# Patient Record
Sex: Male | Born: 1970 | Race: White | Hispanic: No | Marital: Married | Smoking: Never smoker
Health system: Southern US, Community
[De-identification: ages and names within clinical notes are randomized; demographics above are authoritative.]

## PROBLEM LIST (undated history)

## (undated) DIAGNOSIS — R112 Nausea with vomiting, unspecified: Secondary | ICD-10-CM

## (undated) DIAGNOSIS — G43909 Migraine, unspecified, not intractable, without status migrainosus: Secondary | ICD-10-CM

## (undated) DIAGNOSIS — C801 Malignant (primary) neoplasm, unspecified: Secondary | ICD-10-CM

## (undated) DIAGNOSIS — Z87442 Personal history of urinary calculi: Secondary | ICD-10-CM

## (undated) DIAGNOSIS — F419 Anxiety disorder, unspecified: Secondary | ICD-10-CM

## (undated) DIAGNOSIS — Z9889 Other specified postprocedural states: Secondary | ICD-10-CM

## (undated) DIAGNOSIS — I10 Essential (primary) hypertension: Secondary | ICD-10-CM

## (undated) HISTORY — PX: SHOULDER ARTHROSCOPY WITH BICEPS TENDON REPAIR: SHX5674

## (undated) HISTORY — PX: BACK SURGERY: SHX140

## (undated) HISTORY — PX: KNEE SURGERY: SHX244

## (undated) HISTORY — PX: CERVICAL SPINE SURGERY: SHX589

## (undated) HISTORY — DX: Malignant (primary) neoplasm, unspecified: C80.1

## (undated) HISTORY — DX: Anxiety disorder, unspecified: F41.9

## (undated) HISTORY — PX: MELANOMA EXCISION: SHX5266

---

## 2000-01-22 ENCOUNTER — Encounter: Payer: Self-pay | Admitting: Orthopedic Surgery

## 2000-01-22 ENCOUNTER — Encounter: Admission: RE | Admit: 2000-01-22 | Discharge: 2000-01-22 | Payer: Self-pay | Admitting: Orthopedic Surgery

## 2001-05-20 ENCOUNTER — Encounter
Admission: RE | Admit: 2001-05-20 | Discharge: 2001-05-20 | Payer: Self-pay | Admitting: Physical Medicine and Rehabilitation

## 2001-05-20 ENCOUNTER — Encounter: Payer: Self-pay | Admitting: Physical Medicine and Rehabilitation

## 2007-08-20 ENCOUNTER — Emergency Department (HOSPITAL_COMMUNITY): Admission: EM | Admit: 2007-08-20 | Discharge: 2007-08-20 | Payer: Self-pay | Admitting: Emergency Medicine

## 2010-07-02 ENCOUNTER — Ambulatory Visit (HOSPITAL_BASED_OUTPATIENT_CLINIC_OR_DEPARTMENT_OTHER): Admission: RE | Admit: 2010-07-02 | Discharge: 2010-07-02 | Payer: Self-pay | Admitting: Orthopedic Surgery

## 2010-12-06 LAB — POCT HEMOGLOBIN-HEMACUE: Hemoglobin: 14.9 g/dL (ref 13.0–17.0)

## 2011-01-16 ENCOUNTER — Ambulatory Visit (HOSPITAL_BASED_OUTPATIENT_CLINIC_OR_DEPARTMENT_OTHER)
Admission: RE | Admit: 2011-01-16 | Discharge: 2011-01-16 | Disposition: A | Discharge status: Home / Self Care | Payer: Federal, State, Local not specified - PPO | Source: Ambulatory Visit | Attending: Orthopedic Surgery | Admitting: Orthopedic Surgery

## 2011-01-16 DIAGNOSIS — M19079 Primary osteoarthritis, unspecified ankle and foot: Secondary | ICD-10-CM | POA: Insufficient documentation

## 2011-01-16 DIAGNOSIS — Z01812 Encounter for preprocedural laboratory examination: Secondary | ICD-10-CM | POA: Insufficient documentation

## 2011-01-16 DIAGNOSIS — D2120 Benign neoplasm of connective and other soft tissue of unspecified lower limb, including hip: Secondary | ICD-10-CM | POA: Insufficient documentation

## 2011-01-16 DIAGNOSIS — M959 Acquired deformity of musculoskeletal system, unspecified: Secondary | ICD-10-CM | POA: Insufficient documentation

## 2011-01-16 LAB — POCT HEMOGLOBIN-HEMACUE
Hemoglobin: 15.8 g/dL (ref 13.0–17.0)
Hemoglobin: 16.2 g/dL (ref 13.0–17.0)

## 2011-01-27 NOTE — Op Note (Signed)
NAMEJAYMIR, Chad Cole NO.:  [id number]  MEDICAL RECORD NO.:  [id number]           PATIENT TYPE:  LOCATION:                                 FACILITY:  PHYSICIAN:  Leonides Grills, M.D.          DATE OF BIRTH:  DATE OF PROCEDURE:  01/16/2011 DATE OF DISCHARGE:                              OPERATIVE REPORT   PREOPERATIVE DIAGNOSES: 1. Left deep peroneal nerve neuroma. 2. Left great toe interphalangeal joint arthritis.  POSTOPERATIVE DIAGNOSES: 1. Left deep peroneal nerve neuroma. 2. Left great toe interphalangeal joint arthritis. 3. Osteochondral lesion, left great toe, proximal phalanx head.  PROCEDURES: 1. Left excision deep peroneal nerve neuroma. 2. Left great toe IP joint cheilectomy. 3. Debridement drilling osteochondral lesion.  ANESTHESIA:  General.  SURGEON:  Leonides Grills, MD  ASSISTANT:  Richardean Canal, PA  ESTIMATED BLOOD LOSS:  Minimal.  TOURNIQUET TIME:  Approximately 20 minutes.  COMPLICATIONS:  None.  DISPOSITION:  Stable to PR.  INDICATIONS:  This is a 40 year old male who underwent an excision of a spur on the dorsal aspect of the second TMT joint.  Postoperatively, this is complicated by a neuroma in this area.  Preoperatively, he did not have a dorsalis pedis pulse in this area as well.  If there was any pulse, it was thready compared to the contralateral side.  He was consented for the above procedure.  All risks of infection, vessel injury, possibility we would not find the neuroma, possibility that the neuroma may recur, persistent pain, worsening pain, prolonged recovery, stiffness, arthritis, wound healing problems, DVT, PE were all explained.  Questions were encouraged and answered.  DESCRIPTION OF PROCEDURE:  The patient was brought to the operating room, placed in supine position.  After adequate general endotracheal anesthesia was administered with popliteal block as well as Ancef 1 g IV piggyback, left lower extremity  was then prepped and draped in sterile manner over proximally thigh tourniquet.  Limb was gravity exsanguinated and the tourniquet was elevated to 290 mmHg.  A longitudinal incision over previous incision was then made, it was extended proximally and distally.  Dissection was carried down through the scar, this was a very slow and tenuous dissection.  We found that there was a superficial peroneal nerve ball within the scar tissue.  This was such a small branch, this was carefully dissected out and cut back.  We then found that there was a large ball of scar over the second TMT joint.  We released the tourniquet at this point and palpated the area.  There was no true distinct pulse.  There was a thread of possible pulse under this area, but there was no visual pulses well.  We were almost directly on the joint area.  There was a ball of scar in this area and we traced proximal to this and again we found no distinct structure into this area.  We then excised the scar in this area and found that there was no other evidence of neuroma or anything else in this area but was palpated underneath the skin was the  scar that was resected.  Once this was done, we copiously irrigated the area with normal saline.  There was no pulsatile bleeding and again we did not palpate a true dorsalis pedis pulse.  We then made a longitudinal incision on dorsal aspect of the left great toe IP joint.  Dissection was carried down through skin. Hemostasis was obtained.  An EHL tendon was carefully elevated and a capsulotomy of the IP joint was then made.  There was a spur on the top but it was not too large.  This was then removed with the rongeur.  We then entered the IP joint and then laterally, we found that there was a large osteochondral lesion at the third and lateral surface of the proximal phalanx head.  This was then debrided with a House curette and drill holes were placed.  We then removed the dorsal  lateral edge of the proximal phalanx head with a rongeur.  We then ranged the joint and there was no impingement or crepitus in this area.  The dorsal spur off the proximal phalanx was also removed as well.  We do not want to be too aggressive with this due to the fact we did not want to violate the extensor halluces longus to extend insertion.  The area was copiously irrigated with normal saline and this was done under no tourniquet.  The area was copiously irrigated with normal saline.  Subcu was closed with 3-0 Vicryl over all wounds.  Skin was closed with 4-0 nylon.  Sterile dressing was applied.  The patient was stable to the PR.     Leonides Grills, M.D.     PB/MEDQ  D:  01/16/2011  T:  01/17/2011  Job:  [id number]  Electronically Signed by Leonides Grills M.D. on 01/27/2011 09:23:10 AM

## 2012-09-23 HISTORY — PX: GANGLION CYST EXCISION: SHX1691

## 2012-10-24 DIAGNOSIS — C801 Malignant (primary) neoplasm, unspecified: Secondary | ICD-10-CM

## 2012-10-24 HISTORY — DX: Malignant (primary) neoplasm, unspecified: C80.1

## 2013-07-13 ENCOUNTER — Ambulatory Visit (INDEPENDENT_AMBULATORY_CARE_PROVIDER_SITE_OTHER): Payer: Federal, State, Local not specified - PPO | Admitting: Cardiovascular Disease

## 2013-07-13 ENCOUNTER — Encounter (HOSPITAL_COMMUNITY): Payer: Self-pay | Admitting: *Deleted

## 2013-07-13 ENCOUNTER — Encounter: Payer: Self-pay | Admitting: Cardiovascular Disease

## 2013-07-13 VITALS — BP 138/100 | HR 62 | Ht 67.5 in | Wt 184.0 lb

## 2013-07-13 DIAGNOSIS — R609 Edema, unspecified: Secondary | ICD-10-CM

## 2013-07-13 DIAGNOSIS — C4491 Basal cell carcinoma of skin, unspecified: Secondary | ICD-10-CM

## 2013-07-13 DIAGNOSIS — Z79899 Other long term (current) drug therapy: Secondary | ICD-10-CM

## 2013-07-13 DIAGNOSIS — Z8669 Personal history of other diseases of the nervous system and sense organs: Secondary | ICD-10-CM

## 2013-07-13 DIAGNOSIS — R600 Localized edema: Secondary | ICD-10-CM

## 2013-07-13 DIAGNOSIS — Z8249 Family history of ischemic heart disease and other diseases of the circulatory system: Secondary | ICD-10-CM

## 2013-07-13 DIAGNOSIS — M7989 Other specified soft tissue disorders: Secondary | ICD-10-CM

## 2013-07-13 DIAGNOSIS — R5381 Other malaise: Secondary | ICD-10-CM

## 2013-07-13 NOTE — Patient Instructions (Signed)
Your physician recommends that you return for lab work fasting.  Your physician has requested that you have a lower extremity arterial exercise duplex. During this test, exercise and ultrasound are used to evaluate arterial blood flow in the legs. Allow one hour for this exam. There are no restrictions or special instructions.  Your physician has requested that you have an exercise tolerance test. For further information please visit [email or web address] Please also follow instruction sheet, as given.   Your physician recommends that you schedule a follow-up appointment in: 6 WEEKS.

## 2013-07-14 ENCOUNTER — Encounter (INDEPENDENT_AMBULATORY_CARE_PROVIDER_SITE_OTHER): Payer: Self-pay | Admitting: Surgery

## 2013-07-21 ENCOUNTER — Ambulatory Visit (HOSPITAL_COMMUNITY)
Admission: RE | Admit: 2013-07-21 | Discharge: 2013-07-21 | Disposition: A | Discharge status: Home / Self Care | Payer: Federal, State, Local not specified - PPO | Source: Ambulatory Visit | Attending: Cardiovascular Disease | Admitting: Cardiovascular Disease

## 2013-07-21 DIAGNOSIS — R5381 Other malaise: Secondary | ICD-10-CM

## 2013-07-21 DIAGNOSIS — Z8249 Family history of ischemic heart disease and other diseases of the circulatory system: Secondary | ICD-10-CM

## 2013-07-21 DIAGNOSIS — M7989 Other specified soft tissue disorders: Secondary | ICD-10-CM

## 2013-07-21 LAB — COMPREHENSIVE METABOLIC PANEL
ALT: 15 U/L (ref 0–53)
AST: 17 U/L (ref 0–37)
Albumin: 4.6 g/dL (ref 3.5–5.2)
Alkaline Phosphatase: 89 U/L (ref 39–117)
BUN: 14 mg/dL (ref 6–23)
CO2: 25 mEq/L (ref 19–32)
Calcium: 9.1 mg/dL (ref 8.4–10.5)
Chloride: 106 mEq/L (ref 96–112)
Creat: 1.05 mg/dL (ref 0.50–1.35)
Glucose, Bld: 97 mg/dL (ref 70–99)
Potassium: 4.1 mEq/L (ref 3.5–5.3)
Sodium: 139 mEq/L (ref 135–145)
Total Bilirubin: 0.4 mg/dL (ref 0.3–1.2)
Total Protein: 7.1 g/dL (ref 6.0–8.3)

## 2013-07-21 LAB — LIPID PANEL
Cholesterol: 145 mg/dL (ref 0–200)
HDL: 47 mg/dL (ref >39)
LDL Cholesterol: 82 mg/dL (ref 0–99)
Total CHOL/HDL Ratio: 3.1 Ratio
Triglycerides: 81 mg/dL (ref <150)
VLDL: 16 mg/dL (ref 0–40)

## 2013-07-21 LAB — CBC
HCT: 42.5 % (ref 39.0–52.0)
Hemoglobin: 14.8 g/dL (ref 13.0–17.0)
MCH: 29.1 pg (ref 26.0–34.0)
MCHC: 34.8 g/dL (ref 30.0–36.0)
MCV: 83.7 fL (ref 78.0–100.0)
Platelets: 342 10*3/uL (ref 150–400)
RBC: 5.08 MIL/uL (ref 4.22–5.81)
RDW: 13.6 % (ref 11.5–15.5)
WBC: 6.6 10*3/uL (ref 4.0–10.5)

## 2013-07-21 LAB — VITAMIN D 25 HYDROXY (VIT D DEFICIENCY, FRACTURES): Vit D, 25-Hydroxy: 33 ng/mL (ref 30–89)

## 2013-07-21 LAB — TSH: TSH: 1.359 u[IU]/mL (ref 0.350–4.500)

## 2013-07-29 ENCOUNTER — Encounter (HOSPITAL_COMMUNITY): Payer: Federal, State, Local not specified - PPO

## 2013-08-03 ENCOUNTER — Other Ambulatory Visit (INDEPENDENT_AMBULATORY_CARE_PROVIDER_SITE_OTHER): Payer: Self-pay | Admitting: Surgery

## 2013-08-03 ENCOUNTER — Encounter (INDEPENDENT_AMBULATORY_CARE_PROVIDER_SITE_OTHER): Payer: Self-pay | Admitting: Surgery

## 2013-08-03 ENCOUNTER — Ambulatory Visit (INDEPENDENT_AMBULATORY_CARE_PROVIDER_SITE_OTHER): Payer: Federal, State, Local not specified - PPO | Admitting: Surgery

## 2013-08-03 VITALS — BP 138/92 | HR 76 | Temp 98.4°F | Resp 14 | Ht 67.5 in | Wt 184.0 lb

## 2013-08-03 DIAGNOSIS — K429 Umbilical hernia without obstruction or gangrene: Secondary | ICD-10-CM | POA: Insufficient documentation

## 2013-08-03 DIAGNOSIS — K469 Unspecified abdominal hernia without obstruction or gangrene: Secondary | ICD-10-CM

## 2013-08-03 DIAGNOSIS — R1031 Right lower quadrant pain: Secondary | ICD-10-CM

## 2013-08-03 NOTE — Progress Notes (Signed)
Patient ID: Chad Cole, male   DOB: 08/17/1971, 42 y.o.   MRN: [id number]  Chief Complaint  Patient presents with  . New Evaluation    eval RIH and UMB hernia    HPI Chad Cole is a 42 y.o. male.   HPI This is a very pleasant gentleman referred by Dr. Johny Blamer for evaluation of right groin pain. For the last several months he has had increasing pain daily in the right groin which he describes as a burning and ache.   The pain came to down into the thigh. He has not noticed a discrete bulge but reports that he looks fuller in the right groin compared to the left. He has had no nausea or vomiting or obstructive symptoms. He had a previous lymph node removed on the upper thigh during excision of a melanoma earlier this year. At that time he developed a seroma which resolved on its on. He has no other complaints. Past Medical History  Diagnosis Date  . Cancer  feb 2014    malignant melanoma. basal cell    Past Surgical History  Procedure Laterality Date  . Knee surgery      bilateral.  . Melanoma excision      one inside right thigh, one on top of head and left cheek    Family History  Problem Relation Age of Onset  . COPD Mother   . Heart attack Father   . Hypertension Father   . COPD Father   . Hyperlipidemia Father   . Cancer Father     cancer in his thumb really weird name pt says  . Bipolar disorder Brother   . Dementia Maternal Grandmother   . Diabetes Maternal Grandfather   . Heart disease Paternal Grandmother     bad heart valve  . Heart attack Paternal Grandfather   . Cancer - Lung Paternal Grandfather   . Cancer Paternal Grandfather     lung and leukemia  . Scoliosis Daughter     Social History History  Substance Use Topics  . Smoking status: Never Smoker   . Smokeless tobacco: Never Used  . Alcohol Use: No    No Known Allergies  Current Outpatient Prescriptions  Medication Sig Dispense Refill  . RELPAX 40 MG tablet        No current  facility-administered medications for this visit.    Review of Systems Review of Systems  Constitutional: Negative for fever, chills and unexpected weight change.  HENT: Negative for congestion, hearing loss, sore throat, trouble swallowing and voice change.   Eyes: Negative for visual disturbance.  Respiratory: Negative for cough and wheezing.   Cardiovascular: Negative for chest pain, palpitations and leg swelling.  Gastrointestinal: Positive for abdominal pain. Negative for nausea, vomiting, diarrhea, constipation, blood in stool, abdominal distention, anal bleeding and rectal pain.  Genitourinary: Negative for hematuria and difficulty urinating.  Musculoskeletal: Negative for arthralgias.  Skin: Negative for rash and wound.  Neurological: Negative for seizures, syncope, weakness and headaches.  Hematological: Negative for adenopathy. Does not bruise/bleed easily.  Psychiatric/Behavioral: Negative for confusion.    Blood pressure 138/92, pulse 76, temperature 98.4 F (36.9 C), temperature source Temporal, resp. rate 14, height 5' 7.5" (1.715 m), weight 184 lb (83.462 kg).  Physical Exam Physical Exam  Constitutional: He is oriented to person, place, and time. He appears well-developed and well-nourished. No distress.  HENT:  Head: Normocephalic and atraumatic.  Right Ear: External ear normal.  Nose: Nose normal.  Mouth/Throat: Oropharynx is clear and moist.  Eyes: Conjunctivae are normal. Pupils are equal, round, and reactive to light. Right eye exhibits no discharge. Left eye exhibits no discharge. No scleral icterus.  Neck: Normal range of motion. Neck supple. No tracheal deviation present.  Cardiovascular: Normal rate, regular rhythm, normal heart sounds and intact distal pulses.   No murmur heard. Pulmonary/Chest: Effort normal and breath sounds normal. No respiratory distress. He has no wheezes. He has no rales.  Abdominal: Soft. Bowel sounds are normal. He exhibits no  distension. There is no tenderness. There is no rebound.  There is a small, easily reducible umbilical hernia  With him both lying and standing with intense Valsalva maneuvers, I cannot demonstrate an inguinal hernia. The incision on his very proximal right thigh is well healed without evidence of recurrent seroma. I cannot feel any enlarged lymph nodes in the right groin  Musculoskeletal: Normal range of motion. He exhibits no edema and no tenderness.  Lymphadenopathy:    He has no cervical adenopathy.  Neurological: He is alert and oriented to person, place, and time.  Skin: Skin is warm. No rash noted. He is not diaphoretic. No erythema.  Psychiatric: Judgment normal.    Data Reviewed   Assessment    Right groin pain of uncertain etiology Umbilical hernia     Plan    Again, I can not feel a hernia defect. I am going to get a CAT scan of his abdomen and pelvis to see if there is a hernia present in the  Right inguinal area versus lymphadenopathy or a deeper seroma that may be causing his discomfort. I will see him back after the scans to discuss things further.        BLACKMAN,DOUGLAS A 08/03/2013, 9:20 AM

## 2013-08-06 ENCOUNTER — Ambulatory Visit (HOSPITAL_COMMUNITY)
Admission: RE | Admit: 2013-08-06 | Discharge: 2013-08-06 | Disposition: A | Discharge status: Home / Self Care | Payer: Federal, State, Local not specified - PPO | Source: Ambulatory Visit | Attending: Cardiovascular Disease | Admitting: Cardiovascular Disease

## 2013-08-06 ENCOUNTER — Ambulatory Visit
Admission: RE | Admit: 2013-08-06 | Discharge: 2013-08-06 | Disposition: A | Discharge status: Home / Self Care | Payer: Federal, State, Local not specified - PPO | Source: Ambulatory Visit | Attending: Surgery | Admitting: Surgery

## 2013-08-06 DIAGNOSIS — R1031 Right lower quadrant pain: Secondary | ICD-10-CM

## 2013-08-06 DIAGNOSIS — M7989 Other specified soft tissue disorders: Secondary | ICD-10-CM

## 2013-08-06 DIAGNOSIS — Z8249 Family history of ischemic heart disease and other diseases of the circulatory system: Secondary | ICD-10-CM

## 2013-08-06 DIAGNOSIS — R109 Unspecified abdominal pain: Secondary | ICD-10-CM | POA: Insufficient documentation

## 2013-08-06 DIAGNOSIS — M79609 Pain in unspecified limb: Secondary | ICD-10-CM

## 2013-08-06 DIAGNOSIS — K469 Unspecified abdominal hernia without obstruction or gangrene: Secondary | ICD-10-CM

## 2013-08-06 MED ORDER — IOHEXOL 300 MG/ML  SOLN
100.0000 mL | Freq: Once | INTRAMUSCULAR | Status: AC | PRN
  Administered 2013-08-06: 100 mL via INTRAVENOUS

## 2013-08-06 NOTE — Progress Notes (Signed)
Arterial Duplex Lower Ext. Completed. Rita Sturdivant, BS, RDMS, RVT  

## 2013-08-09 ENCOUNTER — Encounter: Payer: Self-pay | Admitting: Cardiovascular Disease

## 2013-08-09 ENCOUNTER — Ambulatory Visit (INDEPENDENT_AMBULATORY_CARE_PROVIDER_SITE_OTHER): Payer: Federal, State, Local not specified - PPO | Admitting: Surgery

## 2013-08-09 ENCOUNTER — Encounter (INDEPENDENT_AMBULATORY_CARE_PROVIDER_SITE_OTHER): Payer: Self-pay | Admitting: Surgery

## 2013-08-09 VITALS — BP 110/70 | HR 76 | Resp 16 | Ht 67.5 in | Wt 184.8 lb

## 2013-08-09 DIAGNOSIS — R1031 Right lower quadrant pain: Secondary | ICD-10-CM

## 2013-08-09 DIAGNOSIS — R600 Localized edema: Secondary | ICD-10-CM | POA: Insufficient documentation

## 2013-08-09 DIAGNOSIS — Z8669 Personal history of other diseases of the nervous system and sense organs: Secondary | ICD-10-CM | POA: Insufficient documentation

## 2013-08-09 DIAGNOSIS — Z8249 Family history of ischemic heart disease and other diseases of the circulatory system: Secondary | ICD-10-CM | POA: Insufficient documentation

## 2013-08-09 NOTE — Progress Notes (Signed)
Subjective:     Patient ID: Chad Cole, male   DOB: 04/28/1971, 42 y.o.   MRN: [id number]  HPI He is here for a followup of his right groin pain. He again still has intermittent pain in the right groin above the inguinal ligament. It is exacerbated by activity. He has not noticed a discrete bulge in the area. He is here today to discuss his CAT scan results.  Review of Systems     Objective:   Physical Exam On physical examination within both lying and standing I cannot feel a hernia defect in the right inguinal area. He does have a small umbilical hernia.  The CAT scan of the abdomen and pelvis was unremarkable regarding any inguinal hernia, inguinal adenopathy, or post operative seroma from his previous excision of the melanoma an inguinal lymph node. There were minimal postoperative changes    Assessment:     Right groin pain of uncertain etiology     Plan:     I suspect this is a muscle strain or potentially a sports hernia. It could also still be mild discomfort from his previous surgery. I discussed laparoscopy at the time of umbilical hernia repair versus continued conservative management with ice packs and anti-inflammatories. He is asymptomatic from the umbilical hernia. He was to receive conservatively which I feel is reasonable. I will see him back in 2 months Unless he notices a bulge or has increased discomfort.

## 2013-08-09 NOTE — Progress Notes (Signed)
Patient ID: Chad Cole, male   DOB: 02/09/1971, 42 y.o.   MRN: [id number]     PATIENT PROFILE: [name] is a 42 year old gentleman is a history of migraine headaches who recently complained of possible circulatory changes in his left foot with left foot edema. He presents for evaluation for evaluation.   HPI: [name] denies any known history of heart disease. He does have a history of migraine headaches for which he takes Maxalt. Recently, he has noted decreased circulation to his left foot. He does have family history for coronary artery disease with father having heart attacks at age 66 and 48. In addition, a grandfather had an MI and underwent CABG surgery. The patient does have a tobacco history. Has also a history of melanoma as well as basal cell CA. He is unaware of any known hypertension and states typically when he checks his blood pressure at the drugstore his blood pressure has been in the 124/90-125/84 range. Because of possible circulatory changes in his left foot with associated left foot swelling he now presents for evaluation.  Past Medical History  Diagnosis Date  . Cancer  feb 2014    malignant melanoma. basal cell    Past Surgical History  Procedure Laterality Date  . Knee surgery      bilateral.  . Melanoma excision      one inside right thigh, one on top of head and left cheek    No Known Allergies  Current Outpatient Prescriptions  Medication Sig Dispense Refill  . RELPAX 40 MG tablet        No current facility-administered medications for this visit.    Social history is notable in that he is married for 20 years. He has 2 children. 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 He completed 14 years of school.  Family History  Problem Relation Age of Onset  . COPD Mother   . Heart attack Father   . Hypertension Father   . COPD Father   . Hyperlipidemia Father   . Cancer Father     cancer in his thumb really weird name pt says    . Bipolar disorder Brother   . Dementia Maternal Grandmother   . Diabetes Maternal Grandfather   . Heart disease Paternal Grandmother     bad heart valve  . Heart attack Paternal Grandfather   . Cancer - Lung Paternal Grandfather   . Cancer Paternal Grandfather     lung and leukemia  . Scoliosis Daughter     ROS is negative for fever chills or night sweats. He has a history of malignant melanoma of his right thigh in March 2014, as well as basal cell carcinoma. He denies cough or excess sputum production. He denies palpitations. He denies chest pressure. He denies significant shortness of breath. He is unaware of tachycardia palpitations. He denies nausea vomiting or diarrhea. He denies GU symptoms. He has noted some intermittent left leg swelling he denies heat or cold intolerance. He is unaware of any diabetes. He denies bleeding. Other comprehensive 14 point system review is negative.  PE BP 138/100  Pulse 62  Ht 5' 7.5" (1.715 m)  Wt 184 lb (83.462 kg)  BMI 28.38 kg/m2 Repeat 124/90 General: Alert, oriented, no distress.  Skin: normal turgor, no rashes HEENT: Normocephalic, atraumatic. Pupils round and reactive; sclera anicteric; Fundi without hemorrhages or exudates. Nose without nasal septal hypertrophy Mouth/Parynx benign; Mallinpatti scale 2 Neck: No JVD, no carotid  bruits Lungs: clear to ausculatation and percussion; no wheezing or rales Heart: RRR, s1 s2 normal 1/6 systolic murmur. Abdomen: soft, nontender; no hepatosplenomehaly, BS+; abdominal aorta nontender and not dilated by palpation. Pulses 2+ with the exception of slightly decreased pulse the left foot Extremities: trace edema left foot; no clubbing cyanosis, Homan's sign negative  Neurologic: grossly nonfocal Psychologic: Normal mood and affect    ECG: Normal sinus rhythm at 62 beats per minute. No significant ST changes. PR interval 150 ms, QTc interval 408 ms.  LABS:  BMET    Component Value Date/Time    NA 139 07/21/2013 0807   K 4.1 07/21/2013 0807   CL 106 07/21/2013 0807   CO2 25 07/21/2013 0807   GLUCOSE 97 07/21/2013 0807   BUN 14 07/21/2013 0807   CREATININE 1.05 07/21/2013 0807   CALCIUM 9.1 07/21/2013 0807     Hepatic Function Panel     Component Value Date/Time   PROT 7.1 07/21/2013 0807   ALBUMIN 4.6 07/21/2013 0807   AST 17 07/21/2013 0807   ALT 15 07/21/2013 0807   ALKPHOS 89 07/21/2013 0807   BILITOT 0.4 07/21/2013 0807     CBC    Component Value Date/Time   WBC 6.6 07/21/2013 0807   RBC 5.08 07/21/2013 0807   HGB 14.8 07/21/2013 0807   HCT 42.5 07/21/2013 0807   PLT 342 07/21/2013 0807   MCV 83.7 07/21/2013 0807   MCH 29.1 07/21/2013 0807   MCHC 34.8 07/21/2013 0807   RDW 13.6 07/21/2013 0807     BNP No results found for this basename: probnp    Lipid Panel     Component Value Date/Time   CHOL 145 07/21/2013 0807   TRIG 81 07/21/2013 0807   HDL 47 07/21/2013 0807   CHOLHDL 3.1 07/21/2013 0807   VLDL 16 07/21/2013 0807   LDLCALC 82 07/21/2013 0807     RADIOLOGY: Ct Abdomen Pelvis W Contrast  08/06/2013   CLINICAL DATA:  42 year old male with right groin pain for 2 months. History of right side hernia and surgery. History of right lower extremity melanoma. Initial encounter.  EXAM: CT ABDOMEN AND PELVIS WITH CONTRAST  TECHNIQUE: Multidetector CT imaging of the abdomen and pelvis was performed using the standard protocol following bolus administration of intravenous contrast.  CONTRAST:  OMNIPAQUE IOHEXOL 300 MG/ML  SOLN  COMPARISON:  None.  FINDINGS: Negative lung bases except for mild atelectasis. No pericardial or pleural effusion. No acute osseous abnormality identified.  No inguinal lymphadenopathy. There is a right inguinal surgical clip and mild adjacent soft tissue scarring (series 3, image 100). No right inguinal hernia. No inguinal inflammatory changes. Pelvic and proximal femoral major vascular structures appear within normal  limits.  No pelvic free fluid. Diminutive, unremarkable bladder. Negative rectum. Decompressed sigmoid colon. Mostly decompressed left colon. Negative transverse colon and hepatic flexure. Negative right colon and appendix. Oral contrast has not yet reached the terminal ileum, but there are no dilated or abnormal small bowel loops identified.  The stomach is moderately distended with contrast and food debris but otherwise within normal limits. Negative duodenum.  Negative liver, gallbladder, spleen, pancreas (some fatty infiltration) and adrenal glands. Portal venous system within normal limits. Major arterial structures in the abdomen and pelvis are normal. Incidental left retro aortic renal vein. Negative kidneys and ureters. No abdominal free fluid.  IMPRESSION: Negative CT of the abdomen and pelvis. Previous right inguinal lymph node dissection with no adverse features or acute findings identified. No  hernia identified.   Electronically Signed   By: Augusto Gamble M.D.   On: 08/06/2013 16:50     ASSESSMENT AND PLAN: My impression is that [name] is a 42 year old gentleman without known prior cardiac history but with strong family history for premature coronary artery disease. The patient does have a history of migraine headaches for which she takes Maxalt. Initially, his blood pressure was elevated at 138/100 repeat was 124/90. There is slight adduction pulse the left foot with a sensation of possible decreased circulation. I am recommending laboratory be checked consisting of a CBC with differential, comprehensive metabolic panel, fasting lipid panel, TSH, as well as vitamin D level. I'm scheduling him to undergo a lower extremity arterial Doppler study. In light of his cardiac family history I'm also recommending he undergo a routine treadmill test as a screening for underlying CAD. I will see him back in the office in followup of the above studies and further recommendations at that time.   Lennette Bihari, MD, [facility] 08/09/2013 4:37 PM

## 2013-08-10 ENCOUNTER — Encounter: Payer: Self-pay | Admitting: Cardiovascular Disease

## 2013-08-24 ENCOUNTER — Encounter: Payer: Self-pay | Admitting: Cardiovascular Disease

## 2013-08-24 ENCOUNTER — Ambulatory Visit (INDEPENDENT_AMBULATORY_CARE_PROVIDER_SITE_OTHER): Payer: Federal, State, Local not specified - PPO | Admitting: Cardiovascular Disease

## 2013-08-24 VITALS — BP 130/88 | HR 62 | Ht 67.5 in | Wt 186.9 lb

## 2013-08-24 DIAGNOSIS — Z8669 Personal history of other diseases of the nervous system and sense organs: Secondary | ICD-10-CM

## 2013-08-24 DIAGNOSIS — Z8249 Family history of ischemic heart disease and other diseases of the circulatory system: Secondary | ICD-10-CM

## 2013-08-24 DIAGNOSIS — M79672 Pain in left foot: Secondary | ICD-10-CM | POA: Insufficient documentation

## 2013-08-24 DIAGNOSIS — M79609 Pain in unspecified limb: Secondary | ICD-10-CM

## 2013-08-24 NOTE — Patient Instructions (Signed)
Your physician recommends that you schedule a follow-up appointment in: AS NEEDED  

## 2013-08-24 NOTE — Progress Notes (Signed)
Patient ID: [name], male   DOB: 1971-09-13, 42 y.o.   MRN: [id number]     HPI:  Chad Cole is a 42 year old gentleman is a history of migraine headaches who recently complained of possible circulatory changes in his left foot with left foot edema and was seen for initial evaluation on 07/13/2013. He presents now for followup evaluation.  Chad Cole denies any known history of heart disease. He does have a history of migraine headaches for which he takes Maxalt. Recently, he  noted decreased circulation to his left foot. He does have family history for coronary artery disease with father having heart attacks at age 41,48, and 68. In addition, a grandfather had an MI and underwent CABG surgery. The patient does have a tobacco history. Has also a history of melanoma as well as basal cell CA. He is unaware of any known hypertension and states typically when he checks his blood pressure at the drugstore his blood pressure has been in the 124/90-125/84 range. Because of possible circulatory changes in his left foot with associated left foot swelling he  presented for evaluation.  Since I saw him, he did undergo lower extremity Doppler studies which were normal. His ABIs were 1.1 bilaterally. There was no evidence for arterial insufficiency. At his last office visit, he did have evidence for diastolic hypertension he underwent a routine treadmill test which was done on 07/21/2013. He was able to exercise for 11 minutes and 2 seconds into the Bruce protocol achieving a work load of 13.4 METs. Peak heart rate was 179 beats per minute representing 99% of predicted maximum. His peak blood pressure was 178/75. The test was stopped secondary to fatigue and shortness of breath. He did not have any chest pain or ECG changes of ischemia. He presents now for followup evaluation. With reference to his left foot, he is status post remote ganglion cyst removal of the dorsum of his left foot since subsequent to this  he did have nerve entrapment and required another procedure. He has had difficulty with his left foot since that time.   Past Medical History  Diagnosis Date  . Cancer  feb 2014    malignant melanoma. basal cell    Past Surgical History  Procedure Laterality Date  . Knee surgery      bilateral.  . Melanoma excision      one inside right thigh, one on top of head and left cheek    No Known Allergies  Current Outpatient Prescriptions  Medication Sig Dispense Refill  . ibuprofen (ADVIL,MOTRIN) 100 MG tablet Take 200 mg by mouth 2 (two) times daily.      . RELPAX 40 MG tablet        No current facility-administered medications for this visit.    Social history is notable in that he is married for 20 years. He has 2 children. 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 He completed 14 years of school.  Family History  Problem Relation Age of Onset  . COPD Mother   . Heart attack Father   . Hypertension Father   . COPD Father   . Hyperlipidemia Father   . Cancer Father     cancer in his thumb really weird name pt says  . Bipolar disorder Brother   . Dementia Maternal Grandmother   . Diabetes Maternal Grandfather   . Heart disease Paternal Grandmother     bad heart valve  . Heart attack Paternal  Grandfather   . Cancer - Lung Paternal Grandfather   . Cancer Paternal Grandfather     lung and leukemia  . Scoliosis Daughter     ROS is negative for fever chills or night sweats. He denies skin rash He has a history of malignant melanoma of his right thigh in March 2014, as well as basal cell carcinoma. He denies cough or excess sputum production. He denies palpitations. He denies chest pressure. He denies significant shortness of breath. He is unaware of tachycardia palpitations. He denies nausea vomiting or diarrhea. He denies GU symptoms. He has noted some intermittent left leg swelling he denies heat or cold intolerance. He is unaware of any diabetes. He denies  bleeding. There are no paresthesias .Other comprehensive 12 point system review is negative.  PE BP 130/88  Pulse 62  Ht 5' 7.5" (1.715 m)  Wt 186 lb 14.4 oz (84.777 kg)  BMI 28.82 kg/m2 Repeat 124/90 General: Alert, oriented, no distress.  Skin: normal turgor, no rashes HEENT: Normocephalic, atraumatic. Pupils round and reactive; sclera anicteric; Fundi without hemorrhages or exudates. Nose without nasal septal hypertrophy Mouth/Parynx benign; Mallinpatti scale 2 Neck: No JVD, no carotid bruits Lungs: clear to ausculatation and percussion; no wheezing or rales Heart: RRR, s1 s2 normal 1/6 systolic murmur. Abdomen: soft, nontender; no hepatosplenomehaly, BS+; abdominal aorta nontender and not dilated by palpation. Pulses 2+; no Extremities: trace edema left foot; no clubbing cyanosis, Homan's sign negative  Neurologic: grossly nonfocal Psychologic: Normal mood and affect   LABS:  BMET    Component Value Date/Time   NA 139 07/21/2013 0807   K 4.1 07/21/2013 0807   CL 106 07/21/2013 0807   CO2 25 07/21/2013 0807   GLUCOSE 97 07/21/2013 0807   BUN 14 07/21/2013 0807   CREATININE 1.05 07/21/2013 0807   CALCIUM 9.1 07/21/2013 0807     Hepatic Function Panel     Component Value Date/Time   PROT 7.1 07/21/2013 0807   ALBUMIN 4.6 07/21/2013 0807   AST 17 07/21/2013 0807   ALT 15 07/21/2013 0807   ALKPHOS 89 07/21/2013 0807   BILITOT 0.4 07/21/2013 0807     CBC    Component Value Date/Time   WBC 6.6 07/21/2013 0807   RBC 5.08 07/21/2013 0807   HGB 14.8 07/21/2013 0807   HCT 42.5 07/21/2013 0807   PLT 342 07/21/2013 0807   MCV 83.7 07/21/2013 0807   MCH 29.1 07/21/2013 0807   MCHC 34.8 07/21/2013 0807   RDW 13.6 07/21/2013 0807     BNP No results found for this basename: probnp    Lipid Panel     Component Value Date/Time   CHOL 145 07/21/2013 0807   TRIG 81 07/21/2013 0807   HDL 47 07/21/2013 0807   CHOLHDL 3.1 07/21/2013 0807   VLDL 16 07/21/2013  0807   LDLCALC 82 07/21/2013 0807     RADIOLOGY: Ct Abdomen Pelvis W Contrast  08/06/2013   CLINICAL DATA:  43 year old male with right groin pain for 2 months. History of right side hernia and surgery. History of right lower extremity melanoma. Initial encounter.  EXAM: CT ABDOMEN AND PELVIS WITH CONTRAST  TECHNIQUE: Multidetector CT imaging of the abdomen and pelvis was performed using the standard protocol following bolus administration of intravenous contrast.  CONTRAST:  OMNIPAQUE IOHEXOL 300 MG/ML  SOLN  COMPARISON:  None.  FINDINGS: Negative lung bases except for mild atelectasis. No pericardial or pleural effusion. No acute osseous abnormality identified.  No inguinal  lymphadenopathy. There is a right inguinal surgical clip and mild adjacent soft tissue scarring (series 3, image 100). No right inguinal hernia. No inguinal inflammatory changes. Pelvic and proximal femoral major vascular structures appear within normal limits.  No pelvic free fluid. Diminutive, unremarkable bladder. Negative rectum. Decompressed sigmoid colon. Mostly decompressed left colon. Negative transverse colon and hepatic flexure. Negative right colon and appendix. Oral contrast has not yet reached the terminal ileum, but there are no dilated or abnormal small bowel loops identified.  The stomach is moderately distended with contrast and food debris but otherwise within normal limits. Negative duodenum.  Negative liver, gallbladder, spleen, pancreas (some fatty infiltration) and adrenal glands. Portal venous system within normal limits. Major arterial structures in the abdomen and pelvis are normal. Incidental left retro aortic renal vein. Negative kidneys and ureters. No abdominal free fluid.  IMPRESSION: Negative CT of the abdomen and pelvis. Previous right inguinal lymph node dissection with no adverse features or acute findings identified. No hernia identified.   Electronically Signed   By: Augusto Gamble M.D.   On:  08/06/2013 16:50     ASSESSMENT AND PLAN:  Mr. Wolman is a 43 year old gentleman without known prior cardiac history but with strong family history for premature coronary artery disease. The patient does have a history of migraine headaches for which he takes Maxalt.  His blood pressure today is improved 130/88 initially and then was 130/86 when taken by me. He did not have an exaggerated blood pressure response to exercise on routine treadmill testing. His lower extremity Doppler study confirms normal arterial blood flow without arterial insufficiency. I did review his laboratory in detail with him. His lipid studies are excellent as well as his CBC and chemistry profile. I have advised continued exercise. I recommended that he periodically followup his diastolic blood pressure. At present, I feel he is stable from a cardiovascular standpoint. I will see him on an as-needed basis in the future if problems arise.   Chad Bihari, Chad Cole, [facility] 08/24/2013 2:39 PM

## 2013-09-28 ENCOUNTER — Ambulatory Visit (INDEPENDENT_AMBULATORY_CARE_PROVIDER_SITE_OTHER): Payer: Federal, State, Local not specified - PPO | Admitting: Surgery

## 2013-09-28 DIAGNOSIS — K429 Umbilical hernia without obstruction or gangrene: Secondary | ICD-10-CM

## 2013-09-28 DIAGNOSIS — R1031 Right lower quadrant pain: Secondary | ICD-10-CM

## 2013-09-28 MED ORDER — OXYCODONE-ACETAMINOPHEN 5-325 MG PO TABS: 1.0000 | ORAL_TABLET | ORAL | Status: AC | PRN

## 2013-09-28 NOTE — Progress Notes (Signed)
Subjective:     Patient ID: Chad Cole, male   DOB: August 11, 1971, 43 y.o.   MRN: [id number]  HPI He is here for another followup of his right groin pain and umbilical hernia. He has no pain at the umbilicus. He continues to have pain in his right groin which has moved to below the inguinal ligament as well. 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 He does appear full on the right groin But I cannot feel a hernia defect although he is weak compared to the other side    Assessment:     Umbilical hernia and right groin pain    Plan:     Again, I discussed laparoscopy with repair of the umbilical hernia and evaluation of the groin. He will come elect to schedule surgery as he has just started a new job. Again, I explained the signs and symptoms of incarceration and he will call me back sooner should he develop any symptoms

## 2013-10-08 ENCOUNTER — Emergency Department (HOSPITAL_BASED_OUTPATIENT_CLINIC_OR_DEPARTMENT_OTHER)
Admission: EM | Admit: 2013-10-08 | Discharge: 2013-10-08 | Disposition: A | Discharge status: Home / Self Care | Payer: Federal, State, Local not specified - PPO | Attending: Emergency Medicine | Admitting: Emergency Medicine

## 2013-10-08 ENCOUNTER — Encounter (HOSPITAL_BASED_OUTPATIENT_CLINIC_OR_DEPARTMENT_OTHER): Payer: Self-pay | Admitting: Emergency Medicine

## 2013-10-08 DIAGNOSIS — R000 Tachycardia, unspecified: Secondary | ICD-10-CM | POA: Insufficient documentation

## 2013-10-08 DIAGNOSIS — Z85820 Personal history of malignant melanoma of skin: Secondary | ICD-10-CM | POA: Insufficient documentation

## 2013-10-08 DIAGNOSIS — F419 Anxiety disorder, unspecified: Secondary | ICD-10-CM | POA: Diagnosis present

## 2013-10-08 DIAGNOSIS — F410 Panic disorder [episodic paroxysmal anxiety] without agoraphobia: Secondary | ICD-10-CM | POA: Insufficient documentation

## 2013-10-08 DIAGNOSIS — R109 Unspecified abdominal pain: Secondary | ICD-10-CM | POA: Insufficient documentation

## 2013-10-08 DIAGNOSIS — Z791 Long term (current) use of non-steroidal anti-inflammatories (NSAID): Secondary | ICD-10-CM | POA: Insufficient documentation

## 2013-10-08 DIAGNOSIS — G43909 Migraine, unspecified, not intractable, without status migrainosus: Secondary | ICD-10-CM | POA: Insufficient documentation

## 2013-10-08 DIAGNOSIS — R209 Unspecified disturbances of skin sensation: Secondary | ICD-10-CM | POA: Insufficient documentation

## 2013-10-08 HISTORY — DX: Migraine, unspecified, not intractable, without status migrainosus: G43.909

## 2013-10-08 LAB — BASIC METABOLIC PANEL
BUN: 18 mg/dL (ref 6–23)
CO2: 25 mEq/L (ref 19–32)
Calcium: 10 mg/dL (ref 8.4–10.5)
Chloride: 102 mEq/L (ref 96–112)
Creatinine, Ser: 1.1 mg/dL (ref 0.50–1.35)
GFR calc Af Amer: >90 mL/min (ref >90)
GFR calc non Af Amer: 82 mL/min — ABNORMAL LOW (ref >90)
Glucose, Bld: 137 mg/dL — ABNORMAL HIGH (ref 70–99)
Potassium: 4.1 mEq/L (ref 3.7–5.3)
Sodium: 141 mEq/L (ref 137–147)

## 2013-10-08 LAB — CBC
HCT: 43.9 % (ref 39.0–52.0)
Hemoglobin: 14.9 g/dL (ref 13.0–17.0)
MCH: 29 pg (ref 26.0–34.0)
MCHC: 33.9 g/dL (ref 30.0–36.0)
MCV: 85.6 fL (ref 78.0–100.0)
Platelets: 284 10*3/uL (ref 150–400)
RBC: 5.13 MIL/uL (ref 4.22–5.81)
RDW: 12.7 % (ref 11.5–15.5)
WBC: 8.1 10*3/uL (ref 4.0–10.5)

## 2013-10-08 MED ORDER — LORAZEPAM 1 MG PO TABS
0.5000 mg | ORAL_TABLET | Freq: Once | ORAL | Status: AC
  Administered 2013-10-08: 0.5 mg via ORAL
  Filled 2013-10-08: qty 1

## 2013-10-08 MED ORDER — ALPRAZOLAM 0.5 MG PO TABS: 0.5000 mg | ORAL_TABLET | Freq: Three times a day (TID) | ORAL | Status: AC | PRN

## 2013-10-08 MED ORDER — LORAZEPAM 1 MG PO TABS
1.0000 mg | ORAL_TABLET | Freq: Once | ORAL | Status: AC
  Administered 2013-10-08: 1 mg via ORAL
  Filled 2013-10-08: qty 1

## 2013-10-08 NOTE — ED Notes (Signed)
Wife sts that pt started having anxiety 4 days ago, states triggered by job; s/s are shob, tingling all over body, "insides are in a knot, I can't calm down, soemtimes I can't walk, I just collapse, feels like high voltage running through me". Denies history of this happening in past.

## 2013-10-08 NOTE — Discharge Instructions (Signed)
Panic Attacks °Panic attacks are sudden, short feelings of great fear or discomfort. You may have them for no reason when you are relaxed, when you are uneasy (anxious), or when you are sleeping.  °HOME CARE °· Take all your medicines as told. °· Check with your doctor before starting new medicines. °· Keep all doctor visits. °GET HELP IF: °· You are not able to take your medicines as told. °· Your symptoms do not get better. °· Your symptoms get worse. °GET HELP RIGHT AWAY IF: °· Your attacks seem different than your normal attacks. °· You have thoughts about hurting yourself or others. °· You take panic attack medicine and you have a side effect. °MAKE SURE YOU: °· Understand these instructions. °· Will watch your condition. °· Will get help right away if you are not doing well or get worse. °Document Released: 10/12/2010 Document Revised: 06/30/2013 Document Reviewed: 04/23/2013 °ExitCare® Patient Information ©2014 [employer]. ° ° °Emergency Department Resource Guide °1) Find a Doctor and Pay Out of Pocket °Although you won't have to find out who is covered by your insurance plan, it is a good idea to ask around and get recommendations. You will then need to call the office and see if the doctor you have chosen will accept you as a new patient and what types of options they offer for patients who are self-pay. Some doctors offer discounts or will set up payment plans for their patients who do not have insurance, but you will need to ask so you aren't surprised when you get to your appointment. ° °2) Contact Your Local Health Department °Not all health departments have doctors that can see patients for sick visits, but many do, so it is worth a call to see if yours does. If you don't know where your local health department is, you can check in your phone book. The CDC also has a tool to help you locate your state's health department, and many state websites also have listings of all of their local health  departments. ° °3) Find a Walk-in Clinic °If your illness is not likely to be very severe or complicated, you may want to try a walk in clinic. These are popping up all over the country in pharmacies, drugstores, and shopping centers. They're usually staffed by nurse practitioners or physician assistants that have been trained to treat common illnesses and complaints. They're usually fairly quick and inexpensive. However, if you have serious medical issues or chronic medical problems, these are probably not your best option. ° °No Primary Care Doctor: °- Call Health Connect at  832-8000 - they can help you locate a primary care doctor that  accepts your insurance, provides certain services, etc. °- Physician Referral Service- 1-800-533-3463 ° °Chronic Pain Problems: °Organization         Address  Phone   Notes  °[facility]  (336) 297-2271 Patients need to be referred by their primary care doctor.  ° °Medication Assistance: °Organization         Address  Phone   Notes  °[place] [facility] [address] °[place], [place] [address] (336) 641-8030 --Must be a resident of [place] °-- Must have NO insurance coverage whatsoever (no Medicaid/ Medicare, etc.) °-- The pt. MUST have a primary care doctor that directs their care regularly and follows them in the community °  °[facility]  (866) 331-1348   °[employer] Way  (888) 892-1162   ° °Agencies that   provide inexpensive medical care: °Organization         Address  Phone   Notes  °[facility]  (336) 832-8035   °[facility]    (336) 832-7272   °[facility] [address] °[place], [place] [address] (336) 832-4777   °[facility] [address], °[place] (336) 271-4999   °[facility]    (336) 373-0678   °[facility]    (336) 272-1050   °[facility] ° [address], [place] Phone:  (336)  832-4444, Fax:  (336) 832-4440 Hours of Operation:  9 am - 6 pm, M-F.  Also accepts Medicaid/Medicare and self-pay.  °[facility] ° [address], [place] Phone: (336) 832-3150, Fax: (336) [phone]. Hours of Operation:  8:30 am - 5:30 pm, M-F.  Also accepts Medicaid and self-pay.  °[facility] [place] [address], [place] Phone: (336) 878-6027   °[facility] [address], [place], [place] (336)723-1848, Ext. 123 Mondays & Thursdays: 7-9 AM.  First 15 patients are seen on a first come, first serve basis. °  ° °Medicaid-accepting [place] Providers: ° °Organization         Address  Phone   Notes  °[facility] [address], [place] (336) 641-2100 Also accepts self-pay patients.  °[facility] [address], [place] ° (336) 856-9996   °[facility] [address], [place] (336) 288-8857   °[facility] [address] [place] [address], [place] (336) 299-7000   °Veita Bland [address], [place]  ° (336) 373-1557 Only accepts [place] Access Medicaid patients after they have their name applied to their card.  ° °Self-Pay (no insurance) in [place]: ° °Organization         Address  Phone   Notes  °Sickle Cell Patients, [facility] [address], [place] (336) 832-1970   °[facility] [address], [place] (336) 832-4400   ° [facility] ° [address], [place] (336) 992-4800   °[facility]/Dr. Osei-Bonsu ° [address] [place] [address], [place] or [address], [place] (336) [phone] Phone number for both [place] and [place] locations is the same.  °[facility] [address], [place] (336) 299-0000   °[facility] [address] [place] [address], [place] or [address] (336)  852-7530 °(336) 878-2260   °[facility] [address], [place] (336) 350-1642, phone; (336) 294-5005, fax Sees patients 1st and 3rd Saturday of every month.  Must not qualify for public or private insurance (i.e. Medicaid, Medicare, [place] Health Choice, Veterans' Benefits) • Household income should be no more than 200% of the poverty level •The clinic cannot treat you if you are pregnant or think you are pregnant • Sexually transmitted diseases are not treated at the clinic.  ° ° °Dental Care: °Organization         Address  Phone  Notes  °[facility] [address], [place] (336) 641-6152 Accepts children up to age 21 who are enrolled in Medicaid or [place] Health Choice; pregnant women with a Medicaid card; and children who have applied for Medicaid or [place] Health   Choice, but were declined, whose parents can pay a reduced fee at time of service.  °[facility] [place]  [address], [place] (336) 641-7733 Accepts children up to age 21 who are enrolled in Medicaid or [place] Health Choice; pregnant women with a Medicaid card; and children who have applied for Medicaid or [place] Health Choice, but were declined, whose parents can pay a reduced fee at time of service.  °[facility] ° [address], [place] (336) 641-4533 Patients are seen by appointment only. Walk-ins are not accepted. [facility] will see patients 18 years of age and older. °Monday - Tuesday (8am-5pm) °Most Wednesdays (8:30-5pm) °$30 per visit, cash only  °[facility] ° [address], [place] (336) 641-4533 Patients are seen by appointment only. Walk-ins are not accepted. [facility] will see patients 18 years of age and older. °One Wednesday Evening (Monthly: Volunteer Based).  $30 per visit, cash only  °[facility]  (919) 537-3737 for adults;  Children under age 4, call Graduate Pediatric Dentistry at (919) [phone]. Children aged 4-14, please call (919) [phone] to request a pediatric application. ° Dental services are provided in all areas of dental care including fillings, crowns and bridges, complete and partial dentures, implants, gum treatment, root canals, and extractions. Preventive care is also provided. Treatment is provided to both adults and children. °Patients are selected via a lottery and there is often a waiting list. °  °[facility] [address], °[place] ° (336) 763-8833 [email or web address] °  °[facility] [address], [place], [place] (336)723-1848, Ext. 123 Second and Fourth Thursday of each month, opens at 6:30 AM; Clinic ends at 9 AM.  Patients are seen on a first-come first-served basis, and a limited number are seen during each clinic.  ° °[facility] ° [address], [place], [place] (336) 723-7904   Eligibility Requirements °You must have lived in [place], [place], or [place] counties for at least the last three months. °  You cannot be eligible for state or federal sponsored healthcare insurance, including Veterans Administration, Medicaid, or Medicare. °  You generally cannot be eligible for healthcare insurance through your employer.  °  How to apply: °Eligibility screenings are held every Tuesday and Wednesday afternoon from 1:00 pm until 4:00 pm. You do not need an appointment for the interview!  °[facility] [address], [place], [place] 336-631-2330   °[facility]  336-342-8273   °[facility]  336-703-3100   °[facility]  336-570-6415   ° °Behavioral Health Resources in the Community: °Intensive Outpatient Programs °Organization         Address  Phone  Notes  °[facility] [address], [place], [place] 336-878-6098   °[facility] [address], [place], [place] 336-832-9800   °[facility] [address], [place], [place] ° 336-882-2125   °[facility] [address],  °[place], [place] 1-800-853-5163 or 336-641-4981   °Substance Abuse Resources °Organization         Address  Phone  Notes  °[facility]  336-882-2125   °[facility]  336-784-9470   °[facility]  336-285-9073   °[facility]  336-845-3988   °Residential & Outpatient Substance Abuse Program  1-800-659-3381   °  Psychological Services °Organization         Address  Phone  Notes  °[facility]  336- 832-9600   °[facility]  336- 378-7881   °[facility] [address], [place] 1-800-853-5163 or 336-641-4981   ° °Mobile Crisis Teams °Organization         Address  Phone  Notes  °[facility], Mobile Crisis Care Unit  1-877-626-1772   °Assertive °[facility] ° [address] [place], [place] 336-834-9664   °Sharon DeEsch [address] °[place] 336-554-5454   ° °Self-Help/Support Groups °Organization         Address  Phone             Notes  °[facility] - variety of support groups  336- 373-1402 Call for more information  °Narcotics Anonymous (NA), [facility] [address], °[place]  2 meetings at this location  ° °Residential Treatment Programs °Organization         Address  Phone  Notes  °[facility] [address],    °[place]  1-866-801-8205   °[facility] ° [address], [place], [place] 704-293-8524   °[facility] [address], [place] 336-845-3988 Admissions: 8am-3pm M-F  °[facility] [address]    °[place], [place] 336-841-1104   °[facility] [address], [place], [place] 336-379-7146   °[facility] [address]  °[place], [place] 336-285-9073   °[facility] - Intensive  Outpatient [address], , [place] 336-852-3033   °[facility] [address]  °[place], [place] 1-877-615-2722 or 336-784-9470   °[facility] [address] [place] [address] [place], [place] 336-227-7417 Accepts Medicaid  °[facility] [address]  ° [place] 1-800-659-3381 Substance Abuse/Addiction Treatment  ° °[place] Behavioral Health Resources °Organization         Address  Phone  Notes  °[facility]  (888) 581-9988   °Julie Brannon, PhD [address] [place], [place]   (336) 349-5553 or (336) 951-0000   °[facility]   [address] °[place], [place] (336) 349-4454   °[facility] [address], [place], [place] (336) 342-8316 Insurance/Medicaid/sponsorship through [facility]  °[facility] [address]                                    [place], [place] (336) 342-8316 Therapy/tele-psych/case  °[facility] [address]  ° [place], [place] (336) 349-2233    °Dr. Arfeen  (336) 349-4544   °[facility] [place]  [employer] [place] [facility] 1) [address],  °2) [address], [place] °3)  [address], [place] (336) 349-3220 °(336) 342-7768 ° °(336) 342-8140   °[place] [facility] (336) 342-1394 or (336) 342-3537 (After Hours)    ° ° ° °

## 2013-10-08 NOTE — ED Notes (Signed)
Wife remains at bedside.

## 2013-10-08 NOTE — ED Notes (Signed)
Spoke with patient at great length in regards to his ansiety and the issues causing the anxiety. Pt tearful. Encouraged pt by suggesting multiple avenues of coping with stress and anxiety. Pt continues to state "I can't, I've tried, I can't". Pt sts at one point during conversation that "I feel like the only way to feel better is if I weren't here". Pt does not admit to plan. Will notify MD.

## 2013-10-08 NOTE — ED Provider Notes (Signed)
CSN: [id number]     Arrival date & time 10/08/13  0998 History   First MD Initiated Contact with Patient 10/08/13 0840     Chief Complaint  Patient presents with  . Panic Attack   (Consider location/radiation/quality/duration/timing/severity/associated sxs/prior Treatment) Patient is a 43 y.o. male presenting with anxiety. The history is provided by the patient.  Anxiety This is a new problem. Episode onset: 3-4 days ago. The problem occurs constantly. The problem has been gradually worsening. Associated symptoms include abdominal pain. Pertinent negatives include no chest pain, no headaches and no shortness of breath. Nothing aggravates the symptoms. Nothing relieves the symptoms. He has tried nothing for the symptoms. The treatment provided no relief.    Past Medical History  Diagnosis Date  . Cancer  feb 2014    malignant melanoma. basal cell  . Migraine    Past Surgical History  Procedure Laterality Date  . Knee surgery      bilateral.  . Melanoma excision      one inside right thigh, one on top of head and left cheek   Family History  Problem Relation Age of Onset  . COPD Mother   . Heart attack Father   . Hypertension Father   . COPD Father   . Hyperlipidemia Father   . Cancer Father     cancer in his thumb really weird name pt says  . Bipolar disorder Brother   . Dementia Maternal Grandmother   . Diabetes Maternal Grandfather   . Heart disease Paternal Grandmother     bad heart valve  . Heart attack Paternal Grandfather   . Cancer - Lung Paternal Grandfather   . Cancer Paternal Grandfather     lung and leukemia  . Scoliosis Daughter    History  Substance Use Topics  . Smoking status: Never Smoker   . Smokeless tobacco: Never Used  . Alcohol Use: No    Review of Systems  Constitutional: Negative for fever.  HENT: Negative for drooling and rhinorrhea.   Eyes: Negative for pain.  Respiratory: Negative for cough and shortness of breath.   Cardiovascular:  Negative for chest pain and leg swelling.  Gastrointestinal: Positive for abdominal pain. Negative for nausea, vomiting and diarrhea.  Genitourinary: Negative for dysuria and hematuria.  Musculoskeletal: Negative for gait problem and neck pain.  Skin: Negative for color change.  Neurological: Negative for numbness and headaches.       Tingling in extremities  Hematological: Negative for adenopathy.  Psychiatric/Behavioral: Negative for behavioral problems.  All other systems reviewed and are negative.    Allergies  Review of patient's allergies indicates no known allergies.  Home Medications   Current Outpatient Rx  Name  Route  Sig  Dispense  Refill  . ibuprofen (ADVIL,MOTRIN) 100 MG tablet   Oral   Take 200 mg by mouth 2 (two) times daily.         Marland Kitchen oxyCODONE-acetaminophen (ROXICET) 5-325 MG per tablet   Oral   Take 1 tablet by mouth every 4 (four) hours as needed for severe pain.   30 tablet   0   . RELPAX 40 MG tablet                BP 150/100  Pulse 127  Temp(Src) 97.7 F (36.5 C) (Oral)  Resp 22  Ht 5\' 7"  (1.702 m)  Wt 178 lb (80.74 kg)  BMI 27.87 kg/m2  SpO2 100% Physical Exam  Nursing note and vitals reviewed. Constitutional: He is  oriented to person, place, and time. He appears well-developed and well-nourished.  HENT:  Head: Normocephalic and atraumatic.  Right Ear: External ear normal.  Left Ear: External ear normal.  Nose: Nose normal.  Mouth/Throat: Oropharynx is clear and moist. No oropharyngeal exudate.  Eyes: Conjunctivae and EOM are normal. Pupils are equal, round, and reactive to light.  Neck: Normal range of motion. Neck supple.  Cardiovascular: Regular rhythm, normal heart sounds and intact distal pulses.  Exam reveals no gallop and no friction rub.   No murmur heard. HR 127 in triage  Pulmonary/Chest: Effort normal and breath sounds normal. No respiratory distress. He has no wheezes.  Abdominal: Soft. Bowel sounds are normal. He  exhibits no distension. There is no tenderness. There is no rebound and no guarding.  Musculoskeletal: Normal range of motion. He exhibits no edema and no tenderness.  Neurological: He is alert and oriented to person, place, and time.  Skin: Skin is warm and dry.  Psychiatric: His behavior is normal. His mood appears anxious.    ED Course  Procedures (including critical care time) Labs Review Labs Reviewed - No data to display Imaging Review No results found.  EKG Interpretation   None       MDM   1. Anxiety   2. Panic attack    9:01 AM 43 y.o. male who presents with anxiety. He states that he has had intermittent tingling in his extremities and the feeling of knots in his stomach for the last 3-4 days. He notes that his symptoms have remained constant. He states that he recently had a job change and decided that he does not like his. He believes this is causing his anxiety. He is afebrile and tachycardic here. He also notes that he had a recent negative workup by cardiology including a stress test. His only risk factor for heart disease his family history. Will get screening EKG to to the nonspecific pains in his abdomen and heart rate. Will give an Ativan.  Nursing informed me about pt stating, " I feel like the only way to feel better is if I weren't here." I had a long discussion with him. He denies SI and has no plan. He states that he feels hopeless and that nothing will help him stop thinking about his issues. (paraphrasing) He is worried about the cramping in his extremities and his stomach. Will get screening labs although I suspect his sx to be related to his anxiety.   12:41 PM: I offered transfer to [facility] versus treatment at home. Will provide Rx for xanax to be used TID prn anxiety. Will also rec pt establish with a counselor vs psychiatrist.  I have discussed the diagnosis/risks/treatment options with the patient and believe the pt to be eligible for discharge home to  follow-up with pcp on monday. We also discussed returning to the ED immediately if new or worsening sx occur. We discussed the sx which are most concerning (e.g., SI, worsening of anxiety) that necessitate immediate return. Any new prescriptions provided to the patient are listed below.  Discharge Medication List as of 10/08/2013 12:43 PM    START taking these medications   Details  ALPRAZolam (XANAX) 0.5 MG tablet Take 1 tablet (0.5 mg total) by mouth 3 (three) times daily as needed for anxiety., Starting 10/08/2013, Until Discontinued, Print         Blanchard Kelch, MD 10/08/13 [phone]

## 2015-11-27 ENCOUNTER — Ambulatory Visit
Admission: RE | Admit: 2015-11-27 | Discharge: 2015-11-27 | Disposition: A | Discharge status: Home / Self Care | Payer: Federal, State, Local not specified - PPO | Source: Ambulatory Visit | Attending: Family Medicine | Admitting: Family Medicine

## 2015-11-27 ENCOUNTER — Other Ambulatory Visit: Payer: Self-pay | Admitting: Family Medicine

## 2015-11-27 DIAGNOSIS — M25512 Pain in left shoulder: Secondary | ICD-10-CM

## 2015-12-19 ENCOUNTER — Other Ambulatory Visit: Payer: Self-pay | Admitting: Family Medicine

## 2015-12-19 DIAGNOSIS — M25512 Pain in left shoulder: Secondary | ICD-10-CM

## 2015-12-24 ENCOUNTER — Ambulatory Visit
Admission: RE | Admit: 2015-12-24 | Discharge: 2015-12-24 | Disposition: A | Discharge status: Home / Self Care | Payer: Federal, State, Local not specified - PPO | Source: Ambulatory Visit | Attending: Family Medicine | Admitting: Family Medicine

## 2015-12-24 DIAGNOSIS — M75112 Incomplete rotator cuff tear or rupture of left shoulder, not specified as traumatic: Secondary | ICD-10-CM | POA: Diagnosis not present

## 2015-12-24 DIAGNOSIS — M25512 Pain in left shoulder: Secondary | ICD-10-CM

## 2015-12-27 DIAGNOSIS — M19012 Primary osteoarthritis, left shoulder: Secondary | ICD-10-CM | POA: Diagnosis not present

## 2016-01-03 DIAGNOSIS — Z08 Encounter for follow-up examination after completed treatment for malignant neoplasm: Secondary | ICD-10-CM | POA: Diagnosis not present

## 2016-01-03 DIAGNOSIS — N62 Hypertrophy of breast: Secondary | ICD-10-CM | POA: Diagnosis not present

## 2016-01-03 DIAGNOSIS — Z85820 Personal history of malignant melanoma of skin: Secondary | ICD-10-CM | POA: Diagnosis not present

## 2016-01-16 DIAGNOSIS — M19012 Primary osteoarthritis, left shoulder: Secondary | ICD-10-CM | POA: Diagnosis not present

## 2016-01-16 DIAGNOSIS — M542 Cervicalgia: Secondary | ICD-10-CM | POA: Diagnosis not present

## 2016-01-22 DIAGNOSIS — M5412 Radiculopathy, cervical region: Secondary | ICD-10-CM | POA: Diagnosis not present

## 2016-01-25 DIAGNOSIS — M542 Cervicalgia: Secondary | ICD-10-CM | POA: Diagnosis not present

## 2016-01-26 DIAGNOSIS — Z85820 Personal history of malignant melanoma of skin: Secondary | ICD-10-CM | POA: Diagnosis not present

## 2016-01-26 DIAGNOSIS — M5412 Radiculopathy, cervical region: Secondary | ICD-10-CM | POA: Diagnosis not present

## 2016-01-26 DIAGNOSIS — N6489 Other specified disorders of breast: Secondary | ICD-10-CM | POA: Diagnosis not present

## 2016-01-26 DIAGNOSIS — N62 Hypertrophy of breast: Secondary | ICD-10-CM | POA: Diagnosis not present

## 2016-01-26 DIAGNOSIS — M79622 Pain in left upper arm: Secondary | ICD-10-CM | POA: Diagnosis not present

## 2016-01-31 DIAGNOSIS — M542 Cervicalgia: Secondary | ICD-10-CM | POA: Diagnosis not present

## 2016-01-31 DIAGNOSIS — M5020 Other cervical disc displacement, unspecified cervical region: Secondary | ICD-10-CM | POA: Diagnosis not present

## 2016-01-31 DIAGNOSIS — M5412 Radiculopathy, cervical region: Secondary | ICD-10-CM | POA: Diagnosis not present

## 2016-02-02 DIAGNOSIS — M5412 Radiculopathy, cervical region: Secondary | ICD-10-CM | POA: Diagnosis not present

## 2016-02-02 DIAGNOSIS — M5020 Other cervical disc displacement, unspecified cervical region: Secondary | ICD-10-CM | POA: Diagnosis not present

## 2016-02-06 DIAGNOSIS — Z01818 Encounter for other preprocedural examination: Secondary | ICD-10-CM | POA: Diagnosis not present

## 2016-02-06 DIAGNOSIS — F419 Anxiety disorder, unspecified: Secondary | ICD-10-CM | POA: Diagnosis not present

## 2016-02-06 DIAGNOSIS — M25519 Pain in unspecified shoulder: Secondary | ICD-10-CM | POA: Diagnosis not present

## 2016-02-13 DIAGNOSIS — M5020 Other cervical disc displacement, unspecified cervical region: Secondary | ICD-10-CM | POA: Diagnosis not present

## 2016-02-13 DIAGNOSIS — M542 Cervicalgia: Secondary | ICD-10-CM | POA: Diagnosis not present

## 2016-02-23 DIAGNOSIS — M50123 Cervical disc disorder at C6-C7 level with radiculopathy: Secondary | ICD-10-CM | POA: Diagnosis not present

## 2016-02-23 DIAGNOSIS — M50223 Other cervical disc displacement at C6-C7 level: Secondary | ICD-10-CM | POA: Diagnosis not present

## 2016-02-29 ENCOUNTER — Emergency Department (HOSPITAL_BASED_OUTPATIENT_CLINIC_OR_DEPARTMENT_OTHER): Payer: Federal, State, Local not specified - PPO

## 2016-02-29 ENCOUNTER — Emergency Department (HOSPITAL_BASED_OUTPATIENT_CLINIC_OR_DEPARTMENT_OTHER)
Admission: EM | Admit: 2016-02-29 | Discharge: 2016-02-29 | Disposition: A | Discharge status: Home / Self Care | Payer: Federal, State, Local not specified - PPO | Attending: Emergency Medicine | Admitting: Emergency Medicine

## 2016-02-29 ENCOUNTER — Encounter (HOSPITAL_BASED_OUTPATIENT_CLINIC_OR_DEPARTMENT_OTHER): Payer: Self-pay | Admitting: *Deleted

## 2016-02-29 DIAGNOSIS — G43809 Other migraine, not intractable, without status migrainosus: Secondary | ICD-10-CM

## 2016-02-29 DIAGNOSIS — Z79899 Other long term (current) drug therapy: Secondary | ICD-10-CM | POA: Insufficient documentation

## 2016-02-29 DIAGNOSIS — Z85828 Personal history of other malignant neoplasm of skin: Secondary | ICD-10-CM | POA: Diagnosis not present

## 2016-02-29 DIAGNOSIS — G43909 Migraine, unspecified, not intractable, without status migrainosus: Secondary | ICD-10-CM | POA: Diagnosis not present

## 2016-02-29 MED ORDER — DEXAMETHASONE SODIUM PHOSPHATE 10 MG/ML IJ SOLN
10.0000 mg | Freq: Once | INTRAMUSCULAR | Status: AC
  Administered 2016-02-29: 10 mg via INTRAVENOUS
  Filled 2016-02-29: qty 1

## 2016-02-29 MED ORDER — HYDROMORPHONE HCL 1 MG/ML IJ SOLN: 0.5000 mg | Freq: Once | INTRAMUSCULAR | Status: DC

## 2016-02-29 MED ORDER — ONDANSETRON HCL 4 MG/2ML IJ SOLN: 4.0000 mg | Freq: Once | INTRAMUSCULAR | Status: DC

## 2016-02-29 MED ORDER — SODIUM CHLORIDE 0.9 % IV BOLUS (SEPSIS)
1000.0000 mL | Freq: Once | INTRAVENOUS | Status: AC
  Administered 2016-02-29: 1000 mL via INTRAVENOUS

## 2016-02-29 MED ORDER — KETOROLAC TROMETHAMINE 30 MG/ML IJ SOLN
30.0000 mg | Freq: Once | INTRAMUSCULAR | Status: AC
  Administered 2016-02-29: 30 mg via INTRAVENOUS
  Filled 2016-02-29: qty 1

## 2016-02-29 MED ORDER — VALPROATE SODIUM 500 MG/5ML IV SOLN
400.0000 mg | Freq: Once | INTRAVENOUS | Status: AC
  Administered 2016-02-29: 400 mg via INTRAVENOUS
  Filled 2016-02-29: qty 5

## 2016-02-29 MED ORDER — DIPHENHYDRAMINE HCL 50 MG/ML IJ SOLN
25.0000 mg | Freq: Once | INTRAMUSCULAR | Status: AC
  Administered 2016-02-29: 25 mg via INTRAVENOUS
  Filled 2016-02-29: qty 1

## 2016-02-29 MED ORDER — PROCHLORPERAZINE EDISYLATE 5 MG/ML IJ SOLN
5.0000 mg | Freq: Once | INTRAMUSCULAR | Status: AC
  Administered 2016-02-29: 5 mg via INTRAVENOUS
  Filled 2016-02-29: qty 2

## 2016-02-29 NOTE — Discharge Instructions (Signed)

## 2016-02-29 NOTE — ED Notes (Signed)
Valproate infusion now complete, pt states headache is much better.

## 2016-02-29 NOTE — ED Notes (Addendum)
Pt c/o " migraine" x 2 days Recent neck surgery x 6 days No relief with Relpax

## 2016-02-29 NOTE — ED Provider Notes (Signed)
CSN: [id number]     Arrival date & time 02/29/16  1307 History   First MD Initiated Contact with Patient 02/29/16 1323     Chief Complaint  Patient presents with  . Migraine     (Consider location/radiation/quality/duration/timing/severity/associated sxs/prior Treatment) HPI Comments: Patient is a 45 year old male with history of migraines who presents with a 2 day history of headache. Patient reports that he has had a throbbing, vice-like headache around his head with a past 2 days. Patient states that his pain is worse than his normal migraines. Patient rates his pain as a 10/10. Patient has had associated photophobia, nausea, vomiting. Patient has also had associated paresthesias from his knees to his toes and elbows to his fingers bilaterally since today. Patient had a total disc replacement of C6 and C7 one week ago. Patient was making good recovery until this headache began 2 days ago. Patient does not have any increasing neck pain or stiffness for the past 2 days. Patient denies any fevers. Patient took his at home Relpax for 2 doses without relief. Patient began vomiting after second dose. Patient denies any vision changes or tinnitus. Patient also denies fevers, chest pain, shortness of breath, dysuria.  Patient is a 45 y.o. male presenting with migraines. The history is provided by the patient.  Migraine Associated symptoms include headaches, nausea, neck pain (since surgery) and vomiting. Pertinent negatives include no abdominal pain, chest pain, chills, fever, rash or sore throat.    Past Medical History  Diagnosis Date  . Cancer [facility])  feb 2014    malignant melanoma. basal cell  . Migraine    Past Surgical History  Procedure Laterality Date  . Knee surgery      bilateral.  . Melanoma excision      one inside right thigh, one on top of head and left cheek  . Back surgery     Family History  Problem Relation Age of Onset  . COPD Mother   . Heart attack Father   .  Hypertension Father   . COPD Father   . Hyperlipidemia Father   . Cancer Father     cancer in his thumb really weird name pt says  . Bipolar disorder Brother   . Dementia Maternal Grandmother   . Diabetes Maternal Grandfather   . Heart disease Paternal Grandmother     bad heart valve  . Heart attack Paternal Grandfather   . Cancer - Lung Paternal Grandfather   . Cancer Paternal Grandfather     lung and leukemia  . Scoliosis Daughter    Social History  Substance Use Topics  . Smoking status: Never Smoker   . Smokeless tobacco: Never Used  . Alcohol Use: No    Review of Systems  Constitutional: Negative for fever and chills.  HENT: Negative for facial swelling and sore throat.   Eyes: Positive for photophobia. Negative for visual disturbance.  Respiratory: Negative for shortness of breath.   Cardiovascular: Negative for chest pain.  Gastrointestinal: Positive for nausea and vomiting. Negative for abdominal pain.  Genitourinary: Negative for dysuria.  Musculoskeletal: Positive for neck pain (since surgery) and neck stiffness (since surgery). Negative for back pain.  Skin: Negative for rash and wound.  Neurological: Positive for headaches.  Psychiatric/Behavioral: The patient is not nervous/anxious.       Allergies  Lyrica  Home Medications   Prior to Admission medications   Medication Sig Start Date End Date Taking? Authorizing Provider  methocarbamol (ROBAXIN) 500 MG tablet Take  500 mg by mouth every 8 (eight) hours as needed for muscle spasms.   Yes Historical Provider, MD  oxyCODONE-acetaminophen (PERCOCET) 10-650 MG tablet Take 1 tablet by mouth every 6 (six) hours as needed for pain.   Yes Historical Provider, MD  ALPRAZolam Duanne Moron) 0.5 MG tablet Take 1 tablet (0.5 mg total) by mouth 3 (three) times daily as needed for anxiety. 10/08/13   Pamella Pert, MD  ibuprofen (ADVIL,MOTRIN) 100 MG tablet Take 200 mg by mouth 2 (two) times daily.    Historical Provider, MD   RELPAX 40 MG tablet  07/14/13   Historical Provider, MD   BP 154/93 mmHg  Pulse 72  Temp(Src) 97.9 F (36.6 C) (Oral)  Resp 16  Ht 5' 7.5" (1.715 m)  Wt 85.276 kg  BMI 28.99 kg/m2  SpO2 98% Physical Exam  Constitutional: He appears well-developed and well-nourished. No distress.  HENT:  Head: Normocephalic and atraumatic.  Mouth/Throat: Oropharynx is clear and moist. No oropharyngeal exudate.  Frontal tenderness on palpation  Eyes: Conjunctivae and EOM are normal. Pupils are equal, round, and reactive to light. Right eye exhibits no discharge. Left eye exhibits no discharge. No scleral icterus.  Neck: Normal range of motion. Neck supple. No thyromegaly present.  Tenderness to palpation with bony tenderness, which patient states is not new with his headache, but since his surgery and prior  Cardiovascular: Normal rate, regular rhythm, normal heart sounds and intact distal pulses.  Exam reveals no gallop and no friction rub.   No murmur heard. Pulmonary/Chest: Effort normal and breath sounds normal. No stridor. No respiratory distress. He has no wheezes. He has no rales.  Abdominal: Soft. Bowel sounds are normal. He exhibits no distension. There is generalized tenderness. There is no rebound and no guarding.  Musculoskeletal: He exhibits no edema.  Lymphadenopathy:    He has no cervical adenopathy.  Neurological: He is alert. Coordination normal.  CN 3-12 intact; normal sensation throughout; 5/5 strength in all 4 extremities; equal bilateral grip strength; no focal deficits  Skin: Skin is warm and dry. No rash noted. He is not diaphoretic. No pallor.  Psychiatric: He has a normal mood and affect.  Nursing note and vitals reviewed.   ED Course  Procedures (including critical care time) Labs Review Labs Reviewed - No data to display  Imaging Review Ct Head Wo Contrast  02/29/2016  CLINICAL DATA:  Migraine for 2 days. Recent neck surgery 6 days ago. EXAM: CT HEAD WITHOUT CONTRAST  CT CERVICAL SPINE WITHOUT CONTRAST TECHNIQUE: Multidetector CT imaging of the head and cervical spine was performed following the standard protocol without intravenous contrast. Multiplanar CT image reconstructions of the cervical spine were also generated. COMPARISON:  None. FINDINGS: CT HEAD FINDINGS There is no evidence of mass effect, midline shift or extra-axial fluid collections. There is no evidence of a space-occupying lesion or intracranial hemorrhage. There is no evidence of a cortical-based area of acute infarction. The ventricles and sulci are appropriate for the patient's age. The basal cisterns are patent. Visualized portions of the orbits are unremarkable. The visualized portions of the paranasal sinuses and mastoid air cells are unremarkable. The osseous structures are unremarkable. CT CERVICAL SPINE FINDINGS The alignment is anatomic. The vertebral body heights are maintained. There is no acute fracture. There is no static listhesis. The prevertebral soft tissues are normal. The intraspinal soft tissues are not fully imaged on this examination due to poor soft tissue contrast, but there is no gross soft tissue abnormality. Intervertebral disc  space replacement C6-7. Mild prevertebral soft tissue swelling focally at C6-7 consistent with postsurgical changes. Small amount of fluid is noted anterior to the C5-6 vertebral bodies which may reflect a postop seroma, but infection cannot be excluded. Remainder of the disc spaces are preserved. The visualized portions of the lung apices demonstrate no focal abnormality. IMPRESSION: 1. No acute intracranial pathology. 2. No acute osseous injury the cervical spine. 3. Intervertebral disc space replacement C6-7. Mild prevertebral soft tissue swelling focally at C6-7 consistent with postsurgical changes. Small amount of fluid is noted anterior to the C5-6 vertebral bodies which may reflect a postop seroma, but infection cannot be excluded. Electronically Signed    By: Kathreen Devoid   On: 02/29/2016 16:21   Ct Cervical Spine Wo Contrast  02/29/2016  CLINICAL DATA:  Migraine for 2 days. Recent neck surgery 6 days ago. EXAM: CT HEAD WITHOUT CONTRAST CT CERVICAL SPINE WITHOUT CONTRAST TECHNIQUE: Multidetector CT imaging of the head and cervical spine was performed following the standard protocol without intravenous contrast. Multiplanar CT image reconstructions of the cervical spine were also generated. COMPARISON:  None. FINDINGS: CT HEAD FINDINGS There is no evidence of mass effect, midline shift or extra-axial fluid collections. There is no evidence of a space-occupying lesion or intracranial hemorrhage. There is no evidence of a cortical-based area of acute infarction. The ventricles and sulci are appropriate for the patient's age. The basal cisterns are patent. Visualized portions of the orbits are unremarkable. The visualized portions of the paranasal sinuses and mastoid air cells are unremarkable. The osseous structures are unremarkable. CT CERVICAL SPINE FINDINGS The alignment is anatomic. The vertebral body heights are maintained. There is no acute fracture. There is no static listhesis. The prevertebral soft tissues are normal. The intraspinal soft tissues are not fully imaged on this examination due to poor soft tissue contrast, but there is no gross soft tissue abnormality. Intervertebral disc space replacement C6-7. Mild prevertebral soft tissue swelling focally at C6-7 consistent with postsurgical changes. Small amount of fluid is noted anterior to the C5-6 vertebral bodies which may reflect a postop seroma, but infection cannot be excluded. Remainder of the disc spaces are preserved. The visualized portions of the lung apices demonstrate no focal abnormality. IMPRESSION: 1. No acute intracranial pathology. 2. No acute osseous injury the cervical spine. 3. Intervertebral disc space replacement C6-7. Mild prevertebral soft tissue swelling focally at C6-7 consistent  with postsurgical changes. Small amount of fluid is noted anterior to the C5-6 vertebral bodies which may reflect a postop seroma, but infection cannot be excluded. Electronically Signed   By: Kathreen Devoid   On: 02/29/2016 16:21   I have personally reviewed and evaluated these images and lab results as part of my medical decision-making.   EKG Interpretation None      1600 After Toradol, Compazine, Benadryl, Decadron, 1 L bolus, patient's headache has improved to a 4/10. I will order CT head and C-spine. 1630 CT head and C-spine shows no acute intracranial pathology. Per Dr. Jonelle Sports recommendation, I will treat the patient with 400mg  Depacon bolus. 1715 Depacon bolus given and running. Per recommendation of Dr. Jeneen Rinks, who is the new attending attending physician, I will give patient 0.5 mg Dilaudid and Zofran as well.   MDM   Pt HA treated and improved while in ED. Patient afebrile. Presentation non concerning for [facility], [facility], Meningitis, or temporal arteritis. Pt is afebrile with no focal neuro deficits, nuchal rigidity, or change in vision. CT head and C-spine shows  no acute intracranial pathology; intervertebral disc space replacement C6/7, mild prevertebral soft tissue swelling focally at C6-7 consistent with postsurgical changes; small amount of fluid is noted anterior to the C5-6 vertebral bodies which may reflect a postoperative seroma. At shift change, patient care transferred to Dr. Tanna Furry for continued evaluation, follow up of migraine following medications and determination of disposition. Anticipate discharge with follow-up to PCP and surgeon at scheduled appointment. Patient is in understanding of plan.  Final diagnoses:  Other type of migraine       Frederica Kuster, PA-C 02/29/16 1723  Leo Grosser, MD 02/29/16 [phone]

## 2016-03-08 DIAGNOSIS — G43809 Other migraine, not intractable, without status migrainosus: Secondary | ICD-10-CM | POA: Diagnosis not present

## 2016-03-28 DIAGNOSIS — G43809 Other migraine, not intractable, without status migrainosus: Secondary | ICD-10-CM | POA: Diagnosis not present

## 2016-04-05 DIAGNOSIS — Z4789 Encounter for other orthopedic aftercare: Secondary | ICD-10-CM | POA: Diagnosis not present

## 2016-04-12 DIAGNOSIS — M5020 Other cervical disc displacement, unspecified cervical region: Secondary | ICD-10-CM | POA: Diagnosis not present

## 2016-04-15 DIAGNOSIS — M5020 Other cervical disc displacement, unspecified cervical region: Secondary | ICD-10-CM | POA: Diagnosis not present

## 2016-04-18 DIAGNOSIS — M5020 Other cervical disc displacement, unspecified cervical region: Secondary | ICD-10-CM | POA: Diagnosis not present

## 2016-04-25 DIAGNOSIS — R030 Elevated blood-pressure reading, without diagnosis of hypertension: Secondary | ICD-10-CM | POA: Diagnosis not present

## 2016-04-25 DIAGNOSIS — G43809 Other migraine, not intractable, without status migrainosus: Secondary | ICD-10-CM | POA: Diagnosis not present

## 2016-04-25 DIAGNOSIS — M5020 Other cervical disc displacement, unspecified cervical region: Secondary | ICD-10-CM | POA: Diagnosis not present

## 2016-04-30 DIAGNOSIS — M5020 Other cervical disc displacement, unspecified cervical region: Secondary | ICD-10-CM | POA: Diagnosis not present

## 2016-05-02 DIAGNOSIS — M5020 Other cervical disc displacement, unspecified cervical region: Secondary | ICD-10-CM | POA: Diagnosis not present

## 2016-05-04 DIAGNOSIS — K080 Exfoliation of teeth due to systemic causes: Secondary | ICD-10-CM | POA: Diagnosis not present

## 2016-05-06 DIAGNOSIS — M5020 Other cervical disc displacement, unspecified cervical region: Secondary | ICD-10-CM | POA: Diagnosis not present

## 2016-05-13 DIAGNOSIS — M5020 Other cervical disc displacement, unspecified cervical region: Secondary | ICD-10-CM | POA: Diagnosis not present

## 2016-05-21 DIAGNOSIS — Z4789 Encounter for other orthopedic aftercare: Secondary | ICD-10-CM | POA: Diagnosis not present

## 2016-05-21 DIAGNOSIS — G43809 Other migraine, not intractable, without status migrainosus: Secondary | ICD-10-CM | POA: Diagnosis not present

## 2016-05-21 DIAGNOSIS — R030 Elevated blood-pressure reading, without diagnosis of hypertension: Secondary | ICD-10-CM | POA: Diagnosis not present

## 2016-05-29 ENCOUNTER — Encounter: Payer: Self-pay | Admitting: Neurology

## 2016-05-29 ENCOUNTER — Ambulatory Visit (INDEPENDENT_AMBULATORY_CARE_PROVIDER_SITE_OTHER): Payer: Federal, State, Local not specified - PPO | Admitting: Neurology

## 2016-05-29 VITALS — BP 167/103 | HR 52 | Ht 67.5 in | Wt 199.8 lb

## 2016-05-29 DIAGNOSIS — G43709 Chronic migraine without aura, not intractable, without status migrainosus: Secondary | ICD-10-CM

## 2016-05-29 DIAGNOSIS — F419 Anxiety disorder, unspecified: Secondary | ICD-10-CM | POA: Diagnosis not present

## 2016-05-29 MED ORDER — NORTRIPTYLINE HCL 25 MG PO CAPS: 50.0000 mg | ORAL_CAPSULE | Freq: Every day | ORAL | 11 refills | Status: DC

## 2016-05-29 NOTE — Progress Notes (Signed)
PATIENT: Chad Cole DOB: December 30, 1970  Chief Complaint  Patient presents with  . Migraine    He is having 4-5 days of headaches, each week, that cause him pressure across his forehead.  He gets about 1-2 migraines per month, that typically last one day and respond well to Relpax. He was just recently started on Nadolol for both blood pressure and headaches.  He has only noticed a slight improvement in headaches since starting this medication and his blood pressure is still high.     HISTORICAL  Chad Cole is a 45 years old right-handed male, seen in refer by his primary care physician Dr. Shirline Frees for evaluation of chronic migraine on May 29 2016,  Reviewed and summarized the referring note, he had a history of melanoma, anxiety disorder, chronic migraine, anterior approaches C6-7 decompression by Dr. Rolena Infante, for left cervical radiculopathy in June 2017.  I reviewed laboratory evaluation in May 2017, normal CBC, with hemoglobin of 14 point 7, normal CMP with creatinine of 1.08, normal TSH, INR,  He reported a history of migraine headaches since age 72, his typical migraine are bilateral retro-orbital area severe pounding headache with associated light noise sensitivity, nauseous, vomiting, lasting for a few hours.  Common trigger for his migraines are exertion, bright light  He used to have migraine couple times each months, previously tried different triptan, now settled on Relpax 40 mg as needed, which works well for him most of the time,  On March 30 2016, he suffered one severe prolonged migraine headache failed home treatment by Relpax twice, he presented to emergency room, received multiple medications, this including Compazine, Toradol, Decadron, Benadryl, later also received a Depacon push, Dilaudid, Zofran, which eventually helped his headache,  I also personally reviewed CT scan of the brain and cervical spine in June 2017, there was no acute abnormality, evidence  of anterior C6-7 fusion.  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   REVIEW OF SYSTEMS: Full 14 system review of systems performed and notable only for trouble swallowing, snoring, increased thirst, headaches, difficulty swallowing, snoring  ALLERGIES: Allergies  Allergen Reactions  . Escitalopram Rash  . Lyrica [Pregabalin] Rash    HOME MEDICATIONS: Current Outpatient Prescriptions  Medication Sig Dispense Refill  . ALPRAZolam (XANAX) 0.5 MG tablet Take 1 tablet (0.5 mg total) by mouth 3 (three) times daily as needed for anxiety. 15 tablet 0  . nadolol (CORGARD) 40 MG tablet daily.    . RELPAX 40 MG tablet     . sertraline (ZOLOFT) 50 MG tablet 25 mg.     No current facility-administered medications for this visit.     PAST MEDICAL HISTORY: Past Medical History:  Diagnosis Date  . Anxiety   . Cancer [facility])  feb 2014   malignant melanoma. basal cell  . Migraine     PAST SURGICAL HISTORY: Past Surgical History:  Procedure Laterality Date  . CERVICAL SPINE SURGERY    . KNEE SURGERY     bilateral.  .  MELANOMA EXCISION     one inside right thigh, one on top of head and left cheek    FAMILY HISTORY: Family History  Problem Relation Age of Onset  . COPD Mother   . Migraines Mother   . Heart attack Father   . Hypertension Father   . COPD Father   . Hyperlipidemia Father   . Cancer Father     cancer in his thumb really weird name pt says  . Bipolar disorder Brother   .  Dementia Maternal Grandmother   . Diabetes Maternal Grandfather   . Heart disease Paternal Grandmother     bad heart valve  . Heart attack Paternal Grandfather   . Cancer - Lung Paternal Grandfather   . Cancer Paternal Grandfather     lung and leukemia  . Scoliosis Daughter     SOCIAL HISTORY:  Social History   Social History  . Marital status: Married    Spouse name: N/A  . Number of children: 2  . Years of education: Associates   Occupational History  . 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 Smoking status: Never Smoker  . Smokeless tobacco: Never Used  . Alcohol use No  . Drug use: No  . Sexual activity: Not on file   Other Topics Concern  . Not on file   Social History Narrative   Lives at home with wife and daughter (one daughter in college).   Right-handed.   2 cups caffeine per week.        PHYSICAL EXAM   Vitals:   05/29/16 1112  BP: (!) 167/103  Pulse: (!) 52  Weight: 199 lb 12 oz (90.6 kg)  Height: 5' 7.5" (1.715 m)    Not recorded      Body mass index is 30.82 kg/m.  PHYSICAL EXAMNIATION:  Gen: NAD, conversant, well nourised, obese, well groomed                     Cardiovascular: Regular rate rhythm, no peripheral edema, warm, nontender. Eyes: Conjunctivae clear without exudates or hemorrhage Neck: Supple, no carotid bruise. Pulmonary: Clear to auscultation bilaterally   NEUROLOGICAL EXAM:  MENTAL STATUS: Speech:    Speech is normal; fluent and spontaneous with normal comprehension.  Cognition:     Orientation to time, place and person     Normal recent and remote memory     Normal Attention span and concentration     Normal Language, naming, repeating,spontaneous speech     Fund of knowledge   CRANIAL NERVES: CN II: Visual fields are full to confrontation. Fundoscopic exam is normal with sharp discs and no vascular changes. Pupils are round equal and briskly reactive to light. CN III, IV, VI:  extraocular movement are normal. No ptosis. CN V: Facial sensation is intact to pinprick in all 3 divisions bilaterally. Corneal responses are intact.  CN VII: Face is symmetric with normal eye closure and smile. CN VIII: Hearing is normal to rubbing fingers CN IX, X: Palate elevates symmetrically. Phonation is normal. CN XI: Head turning and shoulder shrug are intact CN XII: Tongue is midline with normal movements and no atrophy.  MOTOR: There is no pronator drift of out-stretched arms. Muscle bulk and tone are normal. Muscle strength is normal.  REFLEXES: Reflexes are 2+ and symmetric at the biceps, triceps, knees, and ankles. Plantar responses are flexor.  SENSORY: Intact to light touch, pinprick, positional sensation and vibratory sensation  are intact in fingers and toes.  COORDINATION: Rapid alternating movements and fine finger movements are intact. There is no dysmetria on finger-to-nose and heel-knee-shin.    GAIT/STANCE: Posture is normal. Gait is steady with normal steps, base, arm swing, and turning. Heel and toe walking are normal. Tandem gait is normal.  Romberg is absent.   DIAGNOSTIC DATA (LABS, IMAGING, TESTING) - I reviewed patient records, labs, notes, testing and imaging myself where available.   ASSESSMENT AND PLAN  Chad Cole is a 45 y.o. male    Chronic migraine headache  Add on preventive medications nortriptyline 25 mg titrating to 50 mg every night  I also suggested over-the-counter magnesium oxide, riboflavin for potential preventive medications\  He could not tolerate higher dose of beta blocker due to slow heart rate  Continue Relpax 40 mg as needed, he only gets 6 tablets each months, need preauthorization yearly basis,   Depression  Under good control with Zoloft 25 mg every morning, if he headache responding well to nortriptyline, may consider stopped Zoloft,  Marcial Pacas, M.D. Ph.D.  [facility] [address] [place], [place] [address] Ph: [phone] Fax: [phone]  CC: Shirline Frees, MD

## 2016-05-29 NOTE — Patient Instructions (Signed)
Magnesium oxide 400 mg twice a day Riboflavin  100 mg twice a day 

## 2016-06-06 DIAGNOSIS — R1313 Dysphagia, pharyngeal phase: Secondary | ICD-10-CM | POA: Diagnosis not present

## 2016-07-19 DIAGNOSIS — I10 Essential (primary) hypertension: Secondary | ICD-10-CM | POA: Diagnosis not present

## 2016-07-26 DIAGNOSIS — N6489 Other specified disorders of breast: Secondary | ICD-10-CM | POA: Diagnosis not present

## 2016-07-26 DIAGNOSIS — C4371 Malignant melanoma of right lower limb, including hip: Secondary | ICD-10-CM | POA: Diagnosis not present

## 2016-08-05 DIAGNOSIS — I10 Essential (primary) hypertension: Secondary | ICD-10-CM | POA: Diagnosis not present

## 2016-08-29 ENCOUNTER — Encounter: Payer: Self-pay | Admitting: Adult Health

## 2016-08-29 ENCOUNTER — Ambulatory Visit (INDEPENDENT_AMBULATORY_CARE_PROVIDER_SITE_OTHER): Payer: Federal, State, Local not specified - PPO | Admitting: Adult Health

## 2016-08-29 VITALS — BP 148/94 | HR 72 | Resp 14 | Ht 67.5 in | Wt 197.4 lb

## 2016-08-29 DIAGNOSIS — D485 Neoplasm of uncertain behavior of skin: Secondary | ICD-10-CM | POA: Diagnosis not present

## 2016-08-29 DIAGNOSIS — D2271 Melanocytic nevi of right lower limb, including hip: Secondary | ICD-10-CM | POA: Diagnosis not present

## 2016-08-29 DIAGNOSIS — G43009 Migraine without aura, not intractable, without status migrainosus: Secondary | ICD-10-CM

## 2016-08-29 DIAGNOSIS — Z85820 Personal history of malignant melanoma of skin: Secondary | ICD-10-CM | POA: Diagnosis not present

## 2016-08-29 DIAGNOSIS — Z85828 Personal history of other malignant neoplasm of skin: Secondary | ICD-10-CM | POA: Diagnosis not present

## 2016-08-29 DIAGNOSIS — D2272 Melanocytic nevi of left lower limb, including hip: Secondary | ICD-10-CM | POA: Diagnosis not present

## 2016-08-29 DIAGNOSIS — D0359 Melanoma in situ of other part of trunk: Secondary | ICD-10-CM | POA: Diagnosis not present

## 2016-08-29 DIAGNOSIS — Z86018 Personal history of other benign neoplasm: Secondary | ICD-10-CM | POA: Diagnosis not present

## 2016-08-29 DIAGNOSIS — D225 Melanocytic nevi of trunk: Secondary | ICD-10-CM | POA: Diagnosis not present

## 2016-08-29 NOTE — Progress Notes (Signed)
PATIENT: Chad Cole DOB: 1971/01/30  REASON FOR VISIT: follow up HISTORY FROM: patient  HISTORY OF PRESENT ILLNESS: HISTORY 05/29/16 Chad Cole): [name] is a 45 years old right-handed male, seen in refer by his primary care physician Dr. Shirline Frees for evaluation of chronic migraine on May 29 2016,  Reviewed and summarized the referring note, he had a history of melanoma, anxiety disorder, chronic migraine, anterior approaches C6-7 decompression by Dr. Rolena Infante, for left cervical radiculopathy in June 2017.  I reviewed laboratory evaluation in May 2017, normal CBC, with hemoglobin of 14 point 7, normal CMP with creatinine of 1.08, normal TSH, INR,  He reported a history of migraine headaches since age 73, his typical migraine are bilateral retro-orbital area severe pounding headache with associated light noise sensitivity, nauseous, vomiting, lasting for a few hours.  Common trigger for his migraines are exertion, bright light  He used to have migraine couple times each months, previously tried different triptan, now settled on Relpax 40 mg as needed, which works well for him most of the time,  On March 30 2016, he suffered one severe prolonged migraine headache failed home treatment by Relpax twice, he presented to emergency room, received multiple medications, this including Compazine, Toradol, Decadron, Benadryl, later also received a Depacon push, Dilaudid, Zofran, which eventually helped his headache,  I also personally reviewed CT scan of the brain and cervical spine in June 2017, there was no acute abnormality, evidence of anterior C6-7 fusion.  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  Today 08/29/2016:   [name] is a 45 year old male with a history of migraine headaches. He returns today for follow-up. He is currently taking nortriptyline 25 mg at bedtime. He reports that his headaches have significantly improved since he started this medication. He has one or less migraines a month. He states that he may get 4-5 regular headaches but they are easily treated with Tylenol. He did notice that on nortriptyline he has decreased libido. He is also on Zoloft low-dose at 25 mg daily. He does state that if he sleeps longer than normal or sleeps "hard" he may wake up with a migraine. He states that he does not always wake up with headache. Reports that he does snore however his wife has not witnessed any apnea events. He returns today for an evaluation.   REVIEW OF SYSTEMS: Out of a complete 14 system review of symptoms, the patient complains only of the following symptoms, and all other reviewed systems are negative.  Headache, moles  ALLERGIES: Allergies  Allergen Reactions  . Escitalopram Rash  . Lyrica [Pregabalin] Rash    HOME MEDICATIONS: Outpatient Medications Prior to Visit  Medication Sig Dispense Refill  . ALPRAZolam (XANAX) 0.5 MG tablet Take 1 tablet (0.5 mg total) by mouth 3 (three) times daily as needed for anxiety. 15 tablet 0  . nadolol (CORGARD) 40 MG tablet daily.    . nortriptyline (PAMELOR) 25 MG capsule Take 2 capsules (50 mg total) by mouth at  bedtime. 60 capsule 11  . RELPAX 40 MG tablet     . sertraline (ZOLOFT) 50 MG tablet 25 mg.     No facility-administered medications prior to visit.     PAST MEDICAL HISTORY: Past Medical History:  Diagnosis Date  . Anxiety   . Cancer [facility])  feb 2014   malignant melanoma. basal cell  . Migraine      PAST SURGICAL HISTORY: Past Surgical History:  Procedure Laterality Date  . CERVICAL SPINE SURGERY    . KNEE SURGERY     bilateral.  . MELANOMA EXCISION     one inside right thigh, one on top of head and left cheek    FAMILY HISTORY: Family History  Problem Relation Age of Onset  . COPD Mother   . Migraines Mother   . Heart attack Father   . Hypertension Father   . COPD Father   . Hyperlipidemia Father   . Cancer Father     cancer in his thumb really weird name pt says  . Bipolar disorder Brother   . Dementia Maternal Grandmother   . Diabetes Maternal Grandfather   . Heart disease Paternal Grandmother     bad heart valve  . Heart attack Paternal Grandfather   . Cancer - Lung Paternal Grandfather   . Cancer Paternal Grandfather     lung and leukemia  . Scoliosis Daughter     SOCIAL HISTORY: Social History   Social History  . Marital status: Married    Spouse name: N/A  . Number of children: 2  . Years of education: Associates   Occupational History  . Not on file.   Social History Main Topics  . Smoking status: Never Smoker  . Smokeless tobacco: Never Used  . Alcohol use No  . Drug use: No  . Sexual activity: Not on file   Other Topics Concern  . Not on file   Social History Narrative   Lives at home with wife and daughter (one daughter in college).   Right-handed.   2 cups caffeine per week.         PHYSICAL EXAM  Vitals:   08/29/16 0759  BP: (!) 148/94  Pulse: 72  Resp: 14  Weight: 197 lb 6.4 oz (89.5 kg)  Height: 5' 7.5" (1.715 m)   Body mass index is 30.46 kg/m.  Generalized: Well developed, in no acute distress   Neurological examination  Mentation: Alert oriented to time, place, history taking. Follows all commands speech and language fluent Cranial nerve II-XII: Pupils were equal round reactive to light. Extraocular movements were full, visual field were full on confrontational test. Facial sensation and strength were  normal. Uvula tongue midline. Head turning and shoulder shrug  were normal and symmetric. Motor: The motor testing reveals 5 over 5 strength of all 4 extremities. Good symmetric motor tone is noted throughout.  Sensory: Sensory testing is intact to soft touch on all 4 extremities. No evidence of extinction is noted.  Coordination: Cerebellar testing reveals good finger-nose-finger and heel-to-shin bilaterally.  Gait and station: Gait is normal. Tandem gait is normal. Romberg is negative. No drift is seen.  Reflexes: Deep tendon reflexes are symmetric and normal bilaterally.   DIAGNOSTIC DATA (LABS, IMAGING, TESTING) - I reviewed patient records, labs, notes, testing and imaging myself where available.  Lab Results  Component Value Date   WBC 8.1 10/08/2013   HGB 14.9 10/08/2013   HCT 43.9 10/08/2013   MCV 85.6 10/08/2013   PLT  284 10/08/2013      Component Value Date/Time   NA 141 10/08/2013 1115   K 4.1 10/08/2013 1115   CL 102 10/08/2013 1115   CO2 25 10/08/2013 1115   GLUCOSE 137 (H) 10/08/2013 1115   BUN 18 10/08/2013 1115   CREATININE 1.10 10/08/2013 1115   CREATININE 1.05 07/21/2013 0807   CALCIUM 10.0 10/08/2013 1115   PROT 7.1 07/21/2013 0807   ALBUMIN 4.6 07/21/2013 0807   AST 17 07/21/2013 0807   ALT 15 07/21/2013 0807   ALKPHOS 89 07/21/2013 0807   BILITOT 0.4 07/21/2013 0807   GFRNONAA 82 (L) 10/08/2013 1115   GFRAA >90 10/08/2013 1115   Lab Results  Component Value Date   CHOL 145 07/21/2013   HDL 47 07/21/2013   LDLCALC 82 07/21/2013   TRIG 81 07/21/2013   CHOLHDL 3.1 07/21/2013    Lab Results  Component Value Date   TSH 1.359 07/21/2013      ASSESSMENT AND PLAN 45 y.o. year old male  has a past medical history of Anxiety; Cancer [facility]) ( feb 2014); and Migraine. here with:  1. Migraine headache  Overall the patient is doing well. He will continue on nortriptyline 25 mg at bedtime. Advised that if his headache frequency or severity increases we  can increase nortriptyline to 50 mg at bedtime. Advised that he can stop Zoloft however if he notices any changes in his mood that's persistent  he should let us know. He will follow-up in 6 months or sooner if needed.     Ward Givens, MSN, NP-C 08/29/2016, 8:11 AM [facility] [address] [place], [place] [address] [phone]

## 2016-08-29 NOTE — Progress Notes (Signed)
I have reviewed and agreed above plan. 

## 2016-08-29 NOTE — Patient Instructions (Signed)
Continue Nortriptyline 25 mg at bedtime- can increase to 50 mg if headache frequency or severity increases Stop Zoloft If your symptoms worsen or you develop new symptoms please let us know.

## 2016-09-10 ENCOUNTER — Telehealth: Payer: Self-pay | Admitting: Physician Assistant

## 2016-09-10 NOTE — Telephone Encounter (Signed)
Received records from [facility] for appointment on 09/24/16 with Rosaria Ferries, PA.  Records given to [employer] (medical records) for Rhonda's schedule on 09/24/16. lp

## 2016-09-24 ENCOUNTER — Ambulatory Visit (INDEPENDENT_AMBULATORY_CARE_PROVIDER_SITE_OTHER): Payer: Federal, State, Local not specified - PPO | Admitting: Physician Assistant

## 2016-09-24 ENCOUNTER — Encounter: Payer: Self-pay | Admitting: Physician Assistant

## 2016-09-24 VITALS — BP 146/104 | HR 60 | Ht 67.5 in | Wt 199.4 lb

## 2016-09-24 DIAGNOSIS — I10 Essential (primary) hypertension: Secondary | ICD-10-CM | POA: Diagnosis not present

## 2016-09-24 DIAGNOSIS — M19012 Primary osteoarthritis, left shoulder: Secondary | ICD-10-CM | POA: Diagnosis not present

## 2016-09-24 MED ORDER — HYDROCHLOROTHIAZIDE 12.5 MG PO TABS: 12.5000 mg | ORAL_TABLET | Freq: Every day | ORAL | 5 refills | Status: DC

## 2016-09-24 NOTE — Progress Notes (Signed)
Cardiology Office Note   Date:  09/24/2016   ID:  Chad Cole, DOB August 04, 1971, MRN [id number]  PCP:  Shirline Frees, MD  Cardiologist:  Dr Claiborne Billings 08/24/2013  Rosaria Ferries, PA-C   Chief Complaint  Patient presents with  . Establish Care    old Dr Claiborne Billings patient last ov 3 years ago, having issues with blood pressure    History of Present Illness: Chad Cole is a 46 y.o. male with a history of migraines, FH CAD.  Evaluated by Dr Claiborne Billings 2014 for possible decreased circulation L foot, nl ABIs, nl ETT, BP ok, f/u prn. Pt had disc replacement surgery in his neck, since then has had more problems with HAs and BP. PCP has tried several rx, not effective.   Chad Cole presents for cardiac evaluation and BP management.  Pt has BP record with him, SBP 142-176, DBP 93-111. Highest reading he remembers is 190/140.   He is a Development worker, community carrier, works in a truck. Walks some, sits a lot. Rarely exercises, no tobacco, no ETOH, minimal caffeine. He eats moderately well.    At times, without any cause, he will get a hot feeling in his face and ears. He has never checked his BP during one of these episodes.  He never gets chest pain, with or without exertion. He never gets DOE unexpectedly, just if he does an unusual amount of exertion. Occassional numbness and tingling in his L foot since the surgery, not related to exertion. No claudication symptoms.    Past Medical History:  Diagnosis Date  . Anxiety   . Cancer ([facility]) 10/2012   malignant melanoma & basal cell  . Migraine     Past Surgical History:  Procedure Laterality Date  . CERVICAL SPINE SURGERY    . GANGLION CYST EXCISION Left 2014   L foot w/ nerve entrapment and subsequent surgery  . KNEE SURGERY     bilateral.  . MELANOMA EXCISION     one inside right thigh, one on top of head and left cheek    Current Outpatient Prescriptions  Medication Sig Dispense Refill  . ALPRAZolam (XANAX) 0.5 MG tablet Take 1 tablet (0.5 mg  total) by mouth 3 (three) times daily as needed for anxiety. 15 tablet 0  . irbesartan (AVAPRO) 300 MG tablet     . Magnesium Citrate 200 MG TABS 2 tablets    . nadolol (CORGARD) 40 MG tablet daily.    . nortriptyline (PAMELOR) 25 MG capsule Take 2 capsules (50 mg total) by mouth at bedtime. 60 capsule 11  . RELPAX 40 MG tablet     . hydrochlorothiazide (HYDRODIURIL) 12.5 MG tablet Take 1 tablet (12.5 mg total) by mouth daily. 30 tablet 5   No current facility-administered medications for this visit.     Allergies:   Escitalopram and Lyrica [pregabalin]    Social History:  The patient  reports that he has never smoked. He has never used smokeless tobacco. He reports that he does not drink alcohol or use drugs.   Family History:  The patient's family history includes Bipolar disorder in his brother; COPD in his father and mother; Cancer in his father and paternal grandfather; Cancer - Lung in his paternal grandfather; Dementia in his maternal grandmother; Diabetes in his maternal grandfather; Heart attack in his paternal grandfather; Heart attack (age of onset: 4) in his father; Heart disease in his paternal grandmother; Hyperlipidemia in his father; Hypertension in his father; Migraines in his  mother; Scoliosis in his daughter.    ROS:  Please see the history of present illness. All other systems are reviewed and negative.    PHYSICAL EXAM: VS:  BP (!) 146/104 (BP Location: Left Arm, Patient Position: Sitting, Cuff Size: Normal)   Pulse 60   Ht 5' 7.5" (1.715 m)   Wt 199 lb 6 oz (90.4 kg)   BMI 30.77 kg/m  , BMI Body mass index is 30.77 kg/m. GEN: Well nourished, well developed, male in no acute distress  HEENT: normal for age  Neck: no JVD, no carotid bruit, no masses Cardiac: RRR; no murmur, no rubs, or gallops Respiratory:  clear to auscultation bilaterally, normal work of breathing GI: soft, nontender, nondistended, + BS MS: no deformity or atrophy; no edema; distal pulses  are 2+ in all 4 extremities   Skin: warm and dry, no rash Neuro:  Strength and sensation are intact Psych: euthymic mood, full affect   EKG:  EKG is ordered today. The ekg ordered today demonstrates SR, HR 61 Normal intervals, no ischemic changes   Recent Labs: No results found for requested labs within last 8760 hours.    Lipid Panel    Component Value Date/Time   CHOL 145 07/21/2013 0807   TRIG 81 07/21/2013 0807   HDL 47 07/21/2013 0807   CHOLHDL 3.1 07/21/2013 0807   VLDL 16 07/21/2013 0807   LDLCALC 82 07/21/2013 0807     Wt Readings from Last 3 Encounters:  09/24/16 199 lb 6 oz (90.4 kg)  08/29/16 197 lb 6.4 oz (89.5 kg)  05/29/16 199 lb 12 oz (90.6 kg)     Other studies Reviewed: Additional studies/ records that were reviewed today include: Faxed records from Dr Kenton Kingfisher, notes from Dr Claiborne Billings, test resutls.  ASSESSMENT AND PLAN:  1.  HTN: Both his SBP and DBP are elevated, SBP has improved since the last med change.   A diuretic is a good option for dropping the DBP, but it may be difficult for him with his job. Could also try a central alpha agonist such as clonidine or vasodilator such as hydralazine, but he does not wish to add another pill, if possible.  Explained that he is on high doses of the nadolol and the irbesartan, tolerating them without side effects, but no increase is possible. If we add HCTZ, the irbesartan can be switched to Avalide so that the amount of pills he takes per day will not increase. If it would be just as easy or less expensive to take the pills separately, that is okay. He will try low dose of HCTZ and get him back in 2 weeks for a BMET.   Current medicines are reviewed at length with the patient today.  The patient has concerns regarding medicines.  The following changes have been made:  Add HCTZ>>Avalide if pt prefers  Labs/ tests ordered today include:   Orders Placed This Encounter  Procedures  . Basic metabolic panel  .  Basic metabolic panel  . EKG 12-Lead     Disposition:   FU with Dr Claiborne Billings  Signed, Rosaria Ferries, PA-C  09/24/2016 10:18 AM    [place] Phone: [phone]; Fax: [phone]  This note was written with the assistance of speech recognition software. Please excuse any transcriptional errors.

## 2016-09-24 NOTE — Patient Instructions (Signed)
Medication Instructions:  START HYDROCHLOROTHIAZIDE (HCTZ) 12.5MG    If you need a refill on your cardiac medications before your next appointment, please call your pharmacy.  Labwork: BMET IN 2 WEEKS   Special Instructions: CONTINUE TO LOG BLOOD PRESSURE AND CALL WITH VALUES   HAPPY NEW YEAR!    Thank you for choosing [facility] at [facility], LPN

## 2016-10-01 DIAGNOSIS — D0359 Melanoma in situ of other part of trunk: Secondary | ICD-10-CM | POA: Diagnosis not present

## 2016-10-01 DIAGNOSIS — L900 Lichen sclerosus et atrophicus: Secondary | ICD-10-CM | POA: Diagnosis not present

## 2016-10-01 DIAGNOSIS — D485 Neoplasm of uncertain behavior of skin: Secondary | ICD-10-CM | POA: Diagnosis not present

## 2016-10-08 DIAGNOSIS — I509 Heart failure, unspecified: Secondary | ICD-10-CM | POA: Diagnosis not present

## 2016-10-08 LAB — BASIC METABOLIC PANEL
BUN/Creatinine Ratio: 18 (ref 9–20)
BUN: 19 mg/dL (ref 6–24)
CO2: 29 mmol/L (ref 18–29)
Calcium: 9.8 mg/dL (ref 8.7–10.2)
Chloride: 101 mmol/L (ref 96–106)
Creatinine, Ser: 1.07 mg/dL (ref 0.76–1.27)
GFR calc Af Amer: 97 mL/min/{1.73_m2} (ref >59)
GFR calc non Af Amer: 84 mL/min/{1.73_m2} (ref >59)
Glucose: 87 mg/dL (ref 65–99)
Potassium: 4.7 mmol/L (ref 3.5–5.2)
Sodium: 137 mmol/L (ref 134–144)

## 2016-10-11 ENCOUNTER — Telehealth: Payer: Self-pay | Admitting: Physician Assistant

## 2016-10-11 MED ORDER — HYDROCHLOROTHIAZIDE 25 MG PO TABS: 25.0000 mg | ORAL_TABLET | Freq: Every day | ORAL | 5 refills | Status: DC

## 2016-10-11 NOTE — Telephone Encounter (Signed)
Returning your call,concerning his lab results. °

## 2016-10-11 NOTE — Telephone Encounter (Signed)
Spoke w patient and discussed. New Rx sent to pharmacy, msg to [facility] to verify.

## 2016-10-28 ENCOUNTER — Telehealth: Payer: Self-pay | Admitting: Adult Health

## 2016-10-28 MED ORDER — TOPIRAMATE 25 MG PO TABS: ORAL_TABLET | ORAL | 11 refills | Status: AC

## 2016-10-28 NOTE — Telephone Encounter (Signed)
Patient called in reference to nortriptyline (PAMELOR) 25 MG capsule.  Patient states he is still having anxiety and jumpiness has increased along with libido problems.  Please call

## 2016-10-28 NOTE — Telephone Encounter (Signed)
Spoke to pt.  He is taking nortriptyline 25mg  po qhs which has helped well for his migraines.  He has been off the sertraline due to being on the nortriptyline.  He has noted increased anxiety, jumpiness and problems with erectile dysfunction/ libido as SE.   ? What to do.  Try another medication?  Please advise.

## 2016-10-28 NOTE — Telephone Encounter (Signed)
Patient would like to try a different medication. He states that nortriptyline worked well for her headaches however the side effects are interfering with his personal life. We will try the patient on Topamax. I have reviewed side affects with the patient. He will begin with 25 mg at bedtime for 1 week then increase to 50 mg at bedtime thereafter. He will stop nortriptyline once he increases Topamax to 50 mg. If his headaches worsen he will let us know. He will also make [place] aware of the side effects do not resolve.

## 2016-10-28 NOTE — Addendum Note (Signed)
Addended by: Trudie Buckler on: 10/28/2016 05:18 PM   Modules accepted: Orders

## 2016-11-12 ENCOUNTER — Telehealth: Payer: Self-pay | Admitting: Physician Assistant

## 2016-11-12 NOTE — Telephone Encounter (Signed)
New Message     Chad Cole told pt to call back regarding they could combine the   hydrochlorothiazide (HYDRODIURIL) 25 MG tablet Take 1 tablet (25 mg total) by mouth daily.    with his other medication so that he is only taking one pill.   Pt states that this medication is working please call in the medication that is a [facility] of [facility] [place]

## 2016-11-12 NOTE — Telephone Encounter (Signed)
Returned call to patient-pt states he was told at last OV that he could take a medication that combined two of his BP meds to decrease the amount of pills he is taking daily.    Per last OV note:  ASSESSMENT AND PLAN:  1.  HTN: Both his SBP and DBP are elevated, SBP has improved since the last med change.   A diuretic is a good option for dropping the DBP, but it may be difficult for him with his job. Could also try a central alpha agonist such as clonidine or vasodilator such as hydralazine, but he does not wish to add another pill, if possible.  Explained that he is on high doses of the nadolol and the irbesartan, tolerating them without side effects, but no increase is possible. If we add HCTZ, the irbesartan can be switched to Avalide so that the amount of pills he takes per day will not increase. If it would be just as easy or less expensive to take the pills separately, that is okay. He will try low dose of HCTZ and get him back in 2 weeks for a BMET.  The following changes have been made:  Add HCTZ>>Avalide if pt prefers.    Advised patient I would route to Rosaria Ferries PA to get verification.  Pt agrees and verbalized understanding.

## 2016-11-14 NOTE — Telephone Encounter (Signed)
Returned call to patient.  He states HCTZ 25mg  has helped him Avalide does not come w/irbsartan 300 + hctz 25mg  (only hctz 12.5mg ) Advised patient on this - he voiced understanding.  He will continue taking separate pills

## 2016-11-14 NOTE — Telephone Encounter (Signed)
F/U call:  Patient is calling to see if his prescription was sent to the pharmacy or if there were any updates. Please call, thanks.

## 2017-01-20 ENCOUNTER — Telehealth: Payer: Self-pay | Admitting: Adult Health

## 2017-01-20 NOTE — Telephone Encounter (Signed)
Patient can try coming off the medication to see if side effects - Decreased Libido, ED and burning and tingling in hands and feet improve. Of course his headaches may get worse. We have tried nortriptyline in the past and it caused decreased libido and ED.

## 2017-01-20 NOTE — Telephone Encounter (Signed)
Pt says topamax is helping with HA's but he is having tingling in hands/feet, ED and libido problems. He is wanting to know if there's a lesser dose, take 1/2 tab, or take every other day. Please call

## 2017-01-20 NOTE — Telephone Encounter (Signed)
Unfortunately he has only been taking mg po qhs.

## 2017-01-20 NOTE — Telephone Encounter (Signed)
Please verify dose. If he is taking 50 mg at bedtime- he can reduce to 25 mg at bedtime. Usually burning tingling in the hands and feet will improve over time.

## 2017-01-20 NOTE — Telephone Encounter (Signed)
Spoke to pt and relayed to come off topamax and monitor.  He stated he has not had migraine in forever.  He will see how he does and call us back.  He thinks that his hx is one that his migraines will come back.

## 2017-01-27 ENCOUNTER — Ambulatory Visit: Payer: Federal, State, Local not specified - PPO | Admitting: Adult Health

## 2017-01-30 DIAGNOSIS — M19012 Primary osteoarthritis, left shoulder: Secondary | ICD-10-CM | POA: Diagnosis not present

## 2017-01-31 DIAGNOSIS — C4370 Malignant melanoma of unspecified lower limb, including hip: Secondary | ICD-10-CM | POA: Diagnosis not present

## 2017-02-13 ENCOUNTER — Telehealth: Payer: Self-pay | Admitting: Physician Assistant

## 2017-02-13 NOTE — Telephone Encounter (Signed)
New Message    Pt states this medication is working good for him , he was told that you have a medication that was combined so he only has to take 1 pill , he would like that [facility] - 30 days  irbesartan (AVAPRO) 300 MG tablet    hydrochlorothiazide (HYDRODIURIL) 25 MG tablet Take 1 tablet (25 mg total) by

## 2017-02-13 NOTE — Telephone Encounter (Signed)
Spoke with the patient. He stated that at his appointment in January that he had been told that he could take Avalide instead of Hydrochlorothiazide 25 mg and Irbesartan 300 mg combined. He called in February and was informed that this dosage was not available. He is calling back again to see if it may be possible to switch. His blood pressures have been running 120/80's but heart rates have been around the mid 50's. Will speak to the PA for further recommendation.

## 2017-02-13 NOTE — Telephone Encounter (Signed)
Spoke with the patient. In speaking with Rosaria Ferries, PA and to pharm D, there is not a equivalent dosage of Avalide to Hydrochlorothiazide/Irbesartan. He would have to take two of the Avalide 125-12.5 mg tablets daily which could also be more expensive. There patient stated that he would like to stay on his current medication and not switch at this time.

## 2017-03-03 ENCOUNTER — Ambulatory Visit: Payer: Federal, State, Local not specified - PPO | Admitting: Adult Health

## 2017-03-03 DIAGNOSIS — M7552 Bursitis of left shoulder: Secondary | ICD-10-CM | POA: Diagnosis not present

## 2017-03-03 DIAGNOSIS — M25512 Pain in left shoulder: Secondary | ICD-10-CM | POA: Diagnosis not present

## 2017-03-03 DIAGNOSIS — M659 Synovitis and tenosynovitis, unspecified: Secondary | ICD-10-CM | POA: Diagnosis not present

## 2017-03-03 DIAGNOSIS — S43432A Superior glenoid labrum lesion of left shoulder, initial encounter: Secondary | ICD-10-CM | POA: Diagnosis not present

## 2017-03-03 DIAGNOSIS — G8918 Other acute postprocedural pain: Secondary | ICD-10-CM | POA: Diagnosis not present

## 2017-03-03 DIAGNOSIS — M7542 Impingement syndrome of left shoulder: Secondary | ICD-10-CM | POA: Diagnosis not present

## 2017-03-03 DIAGNOSIS — M19012 Primary osteoarthritis, left shoulder: Secondary | ICD-10-CM | POA: Diagnosis not present

## 2017-03-06 DIAGNOSIS — M25512 Pain in left shoulder: Secondary | ICD-10-CM | POA: Diagnosis not present

## 2017-03-11 DIAGNOSIS — M25512 Pain in left shoulder: Secondary | ICD-10-CM | POA: Diagnosis not present

## 2017-03-18 DIAGNOSIS — M25512 Pain in left shoulder: Secondary | ICD-10-CM | POA: Diagnosis not present

## 2017-03-25 DIAGNOSIS — M25512 Pain in left shoulder: Secondary | ICD-10-CM | POA: Diagnosis not present

## 2017-04-01 DIAGNOSIS — M25512 Pain in left shoulder: Secondary | ICD-10-CM | POA: Diagnosis not present

## 2017-04-08 DIAGNOSIS — M25512 Pain in left shoulder: Secondary | ICD-10-CM | POA: Diagnosis not present

## 2017-04-13 ENCOUNTER — Other Ambulatory Visit: Payer: Self-pay | Admitting: Physician Assistant

## 2017-04-15 DIAGNOSIS — M25512 Pain in left shoulder: Secondary | ICD-10-CM | POA: Diagnosis not present

## 2017-04-22 DIAGNOSIS — M25512 Pain in left shoulder: Secondary | ICD-10-CM | POA: Diagnosis not present

## 2017-04-29 DIAGNOSIS — M25512 Pain in left shoulder: Secondary | ICD-10-CM | POA: Diagnosis not present

## 2017-05-05 DIAGNOSIS — N62 Hypertrophy of breast: Secondary | ICD-10-CM | POA: Diagnosis not present

## 2017-05-06 DIAGNOSIS — M25512 Pain in left shoulder: Secondary | ICD-10-CM | POA: Diagnosis not present

## 2017-05-15 DIAGNOSIS — T1511XA Foreign body in conjunctival sac, right eye, initial encounter: Secondary | ICD-10-CM | POA: Diagnosis not present

## 2017-05-20 DIAGNOSIS — K080 Exfoliation of teeth due to systemic causes: Secondary | ICD-10-CM | POA: Diagnosis not present

## 2017-05-20 DIAGNOSIS — M25512 Pain in left shoulder: Secondary | ICD-10-CM | POA: Diagnosis not present

## 2017-05-29 DIAGNOSIS — M25512 Pain in left shoulder: Secondary | ICD-10-CM | POA: Diagnosis not present

## 2017-06-03 DIAGNOSIS — M25512 Pain in left shoulder: Secondary | ICD-10-CM | POA: Diagnosis not present

## 2017-06-19 DIAGNOSIS — Z4789 Encounter for other orthopedic aftercare: Secondary | ICD-10-CM | POA: Diagnosis not present

## 2017-06-19 DIAGNOSIS — M19012 Primary osteoarthritis, left shoulder: Secondary | ICD-10-CM | POA: Diagnosis not present

## 2017-06-30 DIAGNOSIS — N62 Hypertrophy of breast: Secondary | ICD-10-CM | POA: Diagnosis not present

## 2017-07-13 IMAGING — MR MR SHOULDER*L* W/O CM
4 of 5 series · 30 of 40 positions shown · non-contrast
Comparison: None.

CLINICAL DATA: Left shoulder pain for 1-2 months. Weakness in
shoulder and left upper arm. No known injury.

EXAM:
MRI OF THE LEFT SHOULDER WITHOUT CONTRAST
TECHNIQUE: Multiplanar, multisequence MR imaging of the shoulder was performed.
No intravenous contrast was administered.

[Series 3: T2 fat-sat · axial · 4.0mm · 0.55mm/px · z∈[-80,-6]mm · 8 of 17 slices shown (1 of 2)]
[im 1/17]
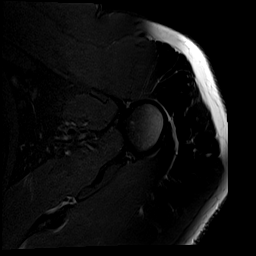
[im 3/17]
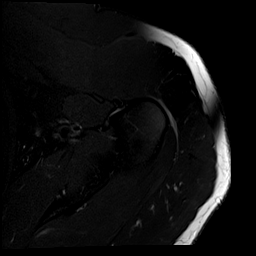
[im 5/17]
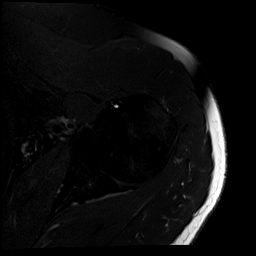
[im 7/17]
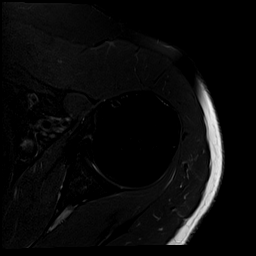
[im 10/17]
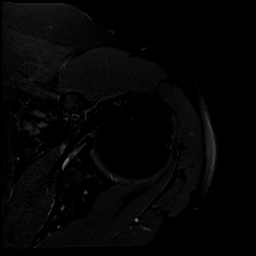
[im 12/17]
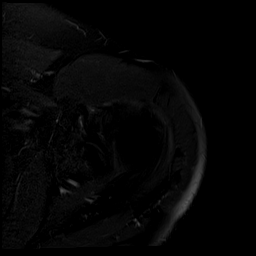
[im 14/17]
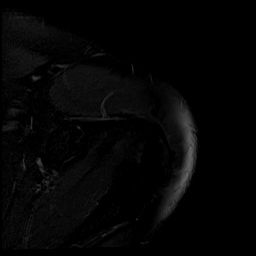
[im 17/17]
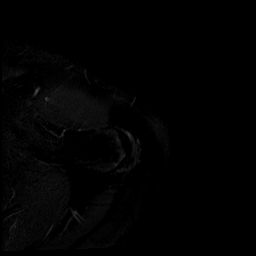

[Series 7: T1 · oblique · 4.0mm · 0.29mm/px · 5 of 15 slices shown]
[im 1/15]
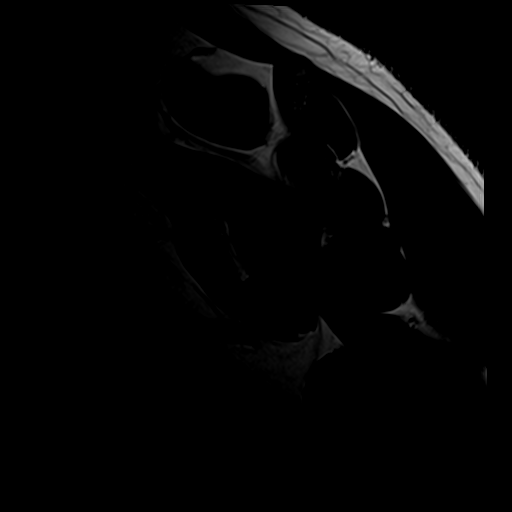
[im 3/15]
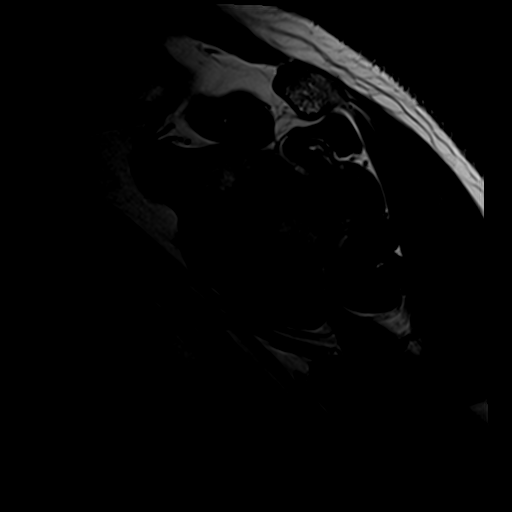
[im 5/15]
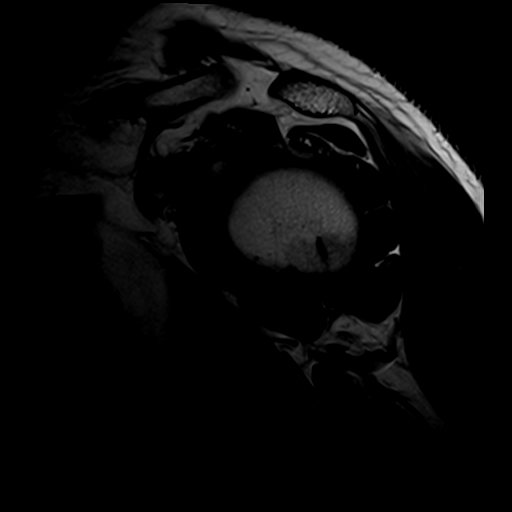
[im 8/15]
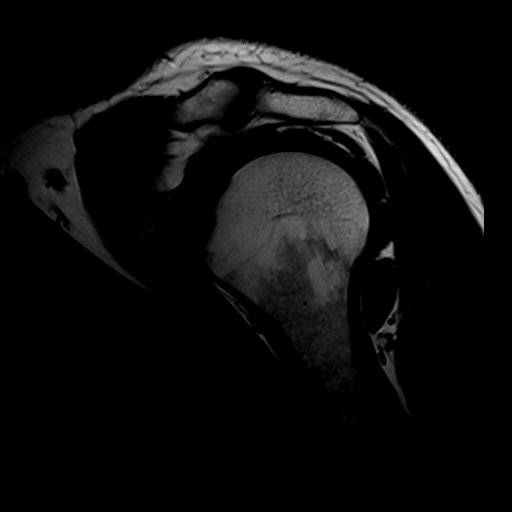
[im 12/15]
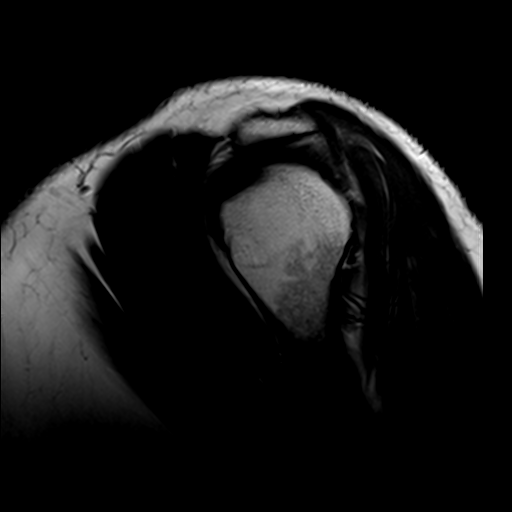

[Series 9: T2 fat-sat · oblique · 4.0mm · 0.62mm/px · 8 of 17 slices shown (2 of 2)]
[im 1/17]
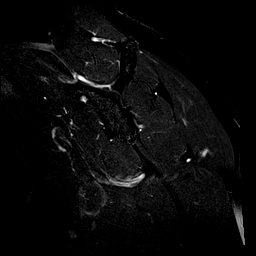
[im 3/17]
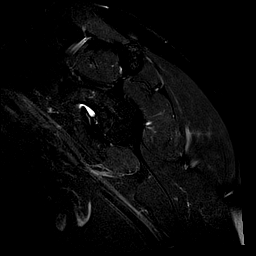
[im 5/17]
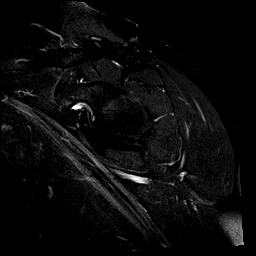
[im 7/17]
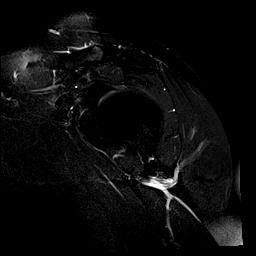
[im 10/17]
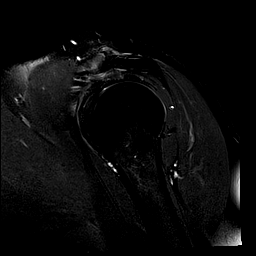
[im 12/17]
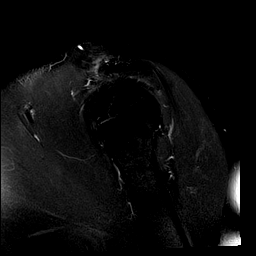
[im 14/17]
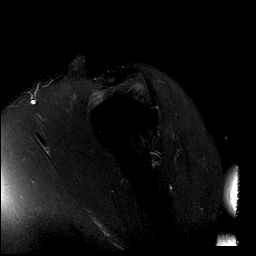
[im 17/17]
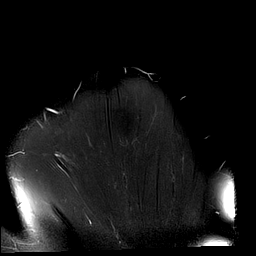

[Series 11: PD · oblique · 4.0mm · 0.31mm/px · 9 of 19 slices shown]
[im 1/19]
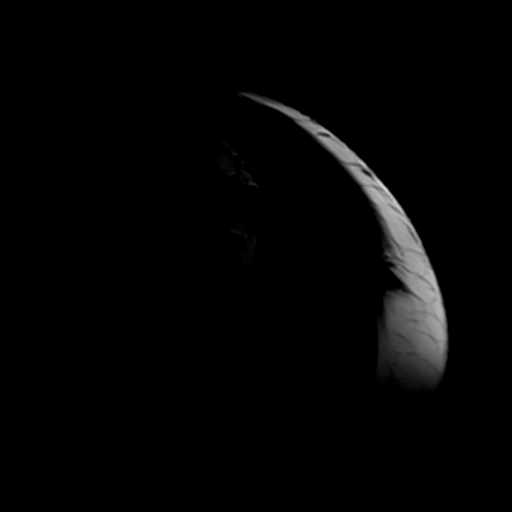
[im 3/19]
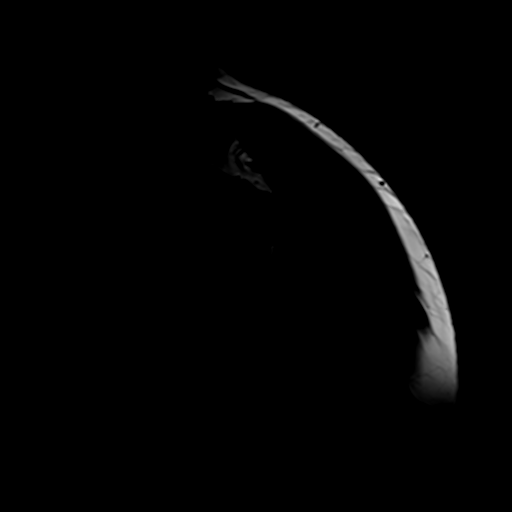
[im 5/19]
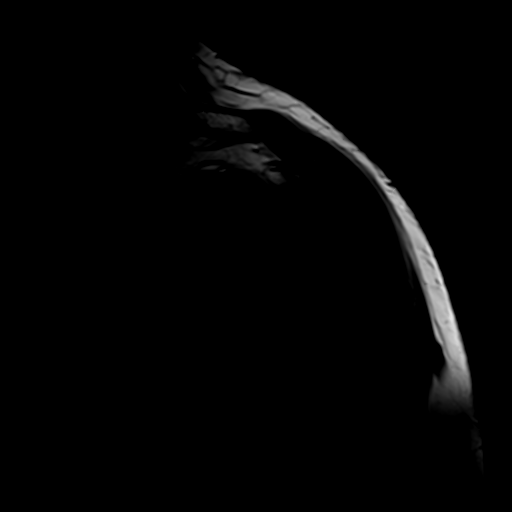
[im 7/19]
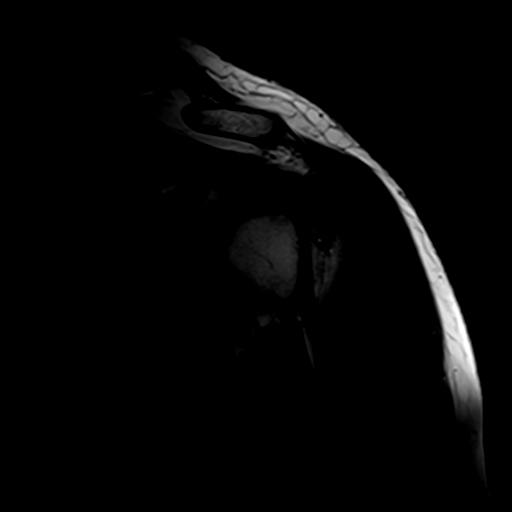
[im 10/19]
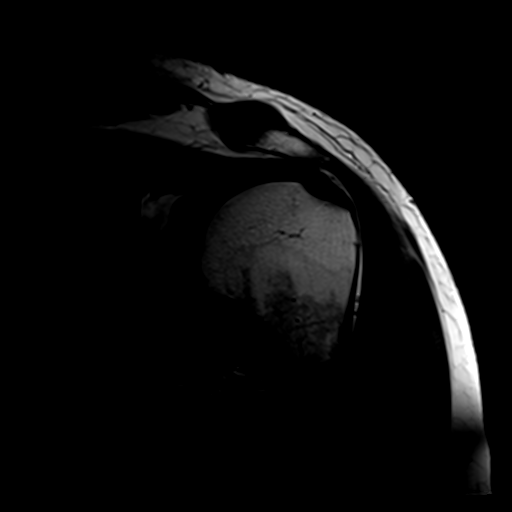
[im 12/19]
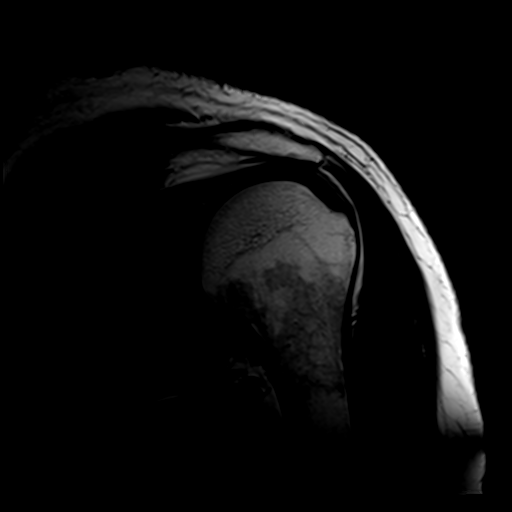
[im 14/19]
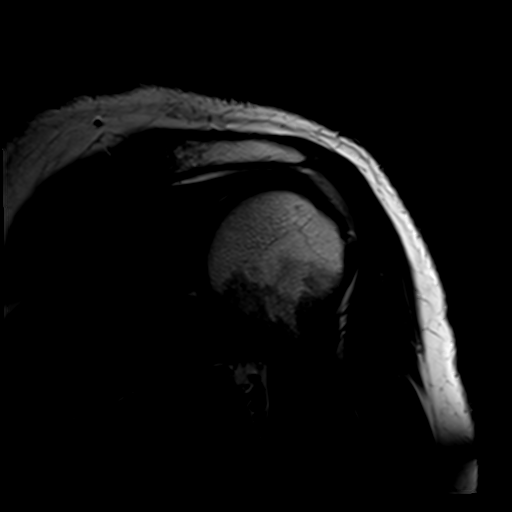
[im 16/19]
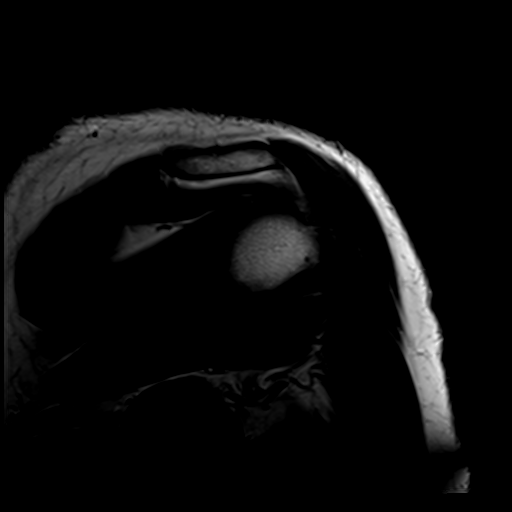
[im 19/19]
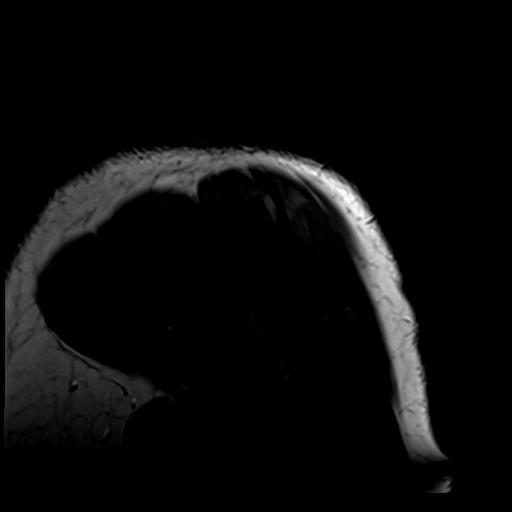

[30 of 40 positions shown; findings below may reference images not displayed]

FINDINGS: Rotator cuff: Mild to moderate supraspinatus tendinopathy/tendinosis
with small interstitial tears and probable shallow articular surface
tears. No full thickness retracted tear. The infraspinatus and
subscapularis tendons are intact. Mild tendinopathy.

Muscles:  Normal

Biceps long head:  Intact.

Acromioclavicular Joint: Mild degenerative changes. The acromion is
type 1-2 in shape. There is moderate lateral downsloping but no
undersurface spurring.

Glenohumeral Joint: Moderate degenerative changes for age. No joint
effusion or synovitis.

Labrum:  Intact.

Bones:  No acute bony findings.  No bone lesions.
IMPRESSION: 1. Mild to moderate supraspinatus tendinopathy/tendinosis with small
interstitial tears and probable shallow articular surface tears. No
full thickness retracted tear.
2. Mild subscapularis and infraspinatus tendinopathy.
3. Intact long head biceps tendon and glenoid labrum.
4. Moderate lateral downsloping of the acromion.
5. Moderate glenohumeral joint degenerative changes for age.
6. Mild subacromial/subdeltoid bursitis.

## 2017-07-31 DIAGNOSIS — N6321 Unspecified lump in the left breast, upper outer quadrant: Secondary | ICD-10-CM | POA: Diagnosis not present

## 2017-07-31 DIAGNOSIS — M25512 Pain in left shoulder: Secondary | ICD-10-CM | POA: Diagnosis not present

## 2017-07-31 DIAGNOSIS — Z4789 Encounter for other orthopedic aftercare: Secondary | ICD-10-CM | POA: Diagnosis not present

## 2017-07-31 DIAGNOSIS — G8929 Other chronic pain: Secondary | ICD-10-CM | POA: Diagnosis not present

## 2017-07-31 DIAGNOSIS — M19012 Primary osteoarthritis, left shoulder: Secondary | ICD-10-CM | POA: Diagnosis not present

## 2017-08-26 DIAGNOSIS — L309 Dermatitis, unspecified: Secondary | ICD-10-CM | POA: Diagnosis not present

## 2017-08-26 DIAGNOSIS — D2271 Melanocytic nevi of right lower limb, including hip: Secondary | ICD-10-CM | POA: Diagnosis not present

## 2017-08-26 DIAGNOSIS — Z86008 Personal history of in-situ neoplasm of other site: Secondary | ICD-10-CM | POA: Diagnosis not present

## 2017-08-26 DIAGNOSIS — D485 Neoplasm of uncertain behavior of skin: Secondary | ICD-10-CM | POA: Diagnosis not present

## 2017-08-26 DIAGNOSIS — D2272 Melanocytic nevi of left lower limb, including hip: Secondary | ICD-10-CM | POA: Diagnosis not present

## 2017-08-26 DIAGNOSIS — C4441 Basal cell carcinoma of skin of scalp and neck: Secondary | ICD-10-CM | POA: Diagnosis not present

## 2017-09-07 ENCOUNTER — Other Ambulatory Visit: Payer: Self-pay | Admitting: Physician Assistant

## 2017-09-17 ENCOUNTER — Other Ambulatory Visit: Payer: Self-pay | Admitting: Physician Assistant

## 2017-09-26 ENCOUNTER — Other Ambulatory Visit: Payer: Self-pay | Admitting: *Deleted

## 2017-09-26 MED ORDER — HYDROCHLOROTHIAZIDE 25 MG PO TABS: 25.0000 mg | ORAL_TABLET | Freq: Every day | ORAL | 0 refills | Status: DC

## 2017-10-01 ENCOUNTER — Other Ambulatory Visit: Payer: Self-pay | Admitting: Physician Assistant

## 2017-10-14 DIAGNOSIS — C4441 Basal cell carcinoma of skin of scalp and neck: Secondary | ICD-10-CM | POA: Diagnosis not present

## 2017-11-18 DIAGNOSIS — C4441 Basal cell carcinoma of skin of scalp and neck: Secondary | ICD-10-CM | POA: Diagnosis not present

## 2017-11-26 DIAGNOSIS — K080 Exfoliation of teeth due to systemic causes: Secondary | ICD-10-CM | POA: Diagnosis not present

## 2017-11-29 ENCOUNTER — Other Ambulatory Visit: Payer: Self-pay | Admitting: Adult Health

## 2017-12-27 ENCOUNTER — Other Ambulatory Visit: Payer: Self-pay | Admitting: Physician Assistant

## 2018-01-15 DIAGNOSIS — F419 Anxiety disorder, unspecified: Secondary | ICD-10-CM | POA: Diagnosis not present

## 2018-01-15 DIAGNOSIS — Z13220 Encounter for screening for lipoid disorders: Secondary | ICD-10-CM | POA: Diagnosis not present

## 2018-01-15 DIAGNOSIS — G43809 Other migraine, not intractable, without status migrainosus: Secondary | ICD-10-CM | POA: Diagnosis not present

## 2018-01-15 DIAGNOSIS — I10 Essential (primary) hypertension: Secondary | ICD-10-CM | POA: Diagnosis not present

## 2018-01-26 DIAGNOSIS — Z13220 Encounter for screening for lipoid disorders: Secondary | ICD-10-CM | POA: Diagnosis not present

## 2018-01-26 DIAGNOSIS — I10 Essential (primary) hypertension: Secondary | ICD-10-CM | POA: Diagnosis not present

## 2018-01-26 DIAGNOSIS — Z125 Encounter for screening for malignant neoplasm of prostate: Secondary | ICD-10-CM | POA: Diagnosis not present

## 2018-01-27 DIAGNOSIS — C4441 Basal cell carcinoma of skin of scalp and neck: Secondary | ICD-10-CM | POA: Diagnosis not present

## 2018-02-27 DIAGNOSIS — C4370 Malignant melanoma of unspecified lower limb, including hip: Secondary | ICD-10-CM | POA: Diagnosis not present

## 2018-03-16 ENCOUNTER — Encounter (HOSPITAL_COMMUNITY): Payer: Self-pay | Admitting: Emergency Medicine

## 2018-03-16 ENCOUNTER — Emergency Department (HOSPITAL_COMMUNITY)
Admission: EM | Admit: 2018-03-16 | Discharge: 2018-03-16 | Disposition: A | Discharge status: Home / Self Care | Payer: Federal, State, Local not specified - PPO | Attending: Emergency Medicine | Admitting: Emergency Medicine

## 2018-03-16 ENCOUNTER — Emergency Department (HOSPITAL_COMMUNITY): Payer: Federal, State, Local not specified - PPO

## 2018-03-16 DIAGNOSIS — R52 Pain, unspecified: Secondary | ICD-10-CM | POA: Diagnosis not present

## 2018-03-16 DIAGNOSIS — R1084 Generalized abdominal pain: Secondary | ICD-10-CM | POA: Diagnosis not present

## 2018-03-16 DIAGNOSIS — Z85820 Personal history of malignant melanoma of skin: Secondary | ICD-10-CM | POA: Diagnosis not present

## 2018-03-16 DIAGNOSIS — I2510 Atherosclerotic heart disease of native coronary artery without angina pectoris: Secondary | ICD-10-CM | POA: Diagnosis not present

## 2018-03-16 DIAGNOSIS — Z79899 Other long term (current) drug therapy: Secondary | ICD-10-CM | POA: Diagnosis not present

## 2018-03-16 DIAGNOSIS — R1013 Epigastric pain: Secondary | ICD-10-CM

## 2018-03-16 DIAGNOSIS — Z85828 Personal history of other malignant neoplasm of skin: Secondary | ICD-10-CM | POA: Insufficient documentation

## 2018-03-16 DIAGNOSIS — R112 Nausea with vomiting, unspecified: Secondary | ICD-10-CM | POA: Insufficient documentation

## 2018-03-16 DIAGNOSIS — R61 Generalized hyperhidrosis: Secondary | ICD-10-CM | POA: Diagnosis not present

## 2018-03-16 DIAGNOSIS — R202 Paresthesia of skin: Secondary | ICD-10-CM | POA: Diagnosis not present

## 2018-03-16 DIAGNOSIS — R1011 Right upper quadrant pain: Secondary | ICD-10-CM | POA: Diagnosis not present

## 2018-03-16 HISTORY — DX: Essential (primary) hypertension: I10

## 2018-03-16 LAB — COMPREHENSIVE METABOLIC PANEL
ALT: 22 U/L (ref 17–63)
AST: 23 U/L (ref 15–41)
Albumin: 4.1 g/dL (ref 3.5–5.0)
Alkaline Phosphatase: 89 U/L (ref 38–126)
Anion gap: 8 (ref 5–15)
BUN: 18 mg/dL (ref 6–20)
CO2: 25 mmol/L (ref 22–32)
Calcium: 8.7 mg/dL — ABNORMAL LOW (ref 8.9–10.3)
Chloride: 107 mmol/L (ref 101–111)
Creatinine, Ser: 1.04 mg/dL (ref 0.61–1.24)
GFR calc Af Amer: >60 mL/min (ref >60)
GFR calc non Af Amer: >60 mL/min (ref >60)
Glucose, Bld: 99 mg/dL (ref 65–99)
Potassium: 3.3 mmol/L — ABNORMAL LOW (ref 3.5–5.1)
Sodium: 140 mmol/L (ref 135–145)
Total Bilirubin: 0.7 mg/dL (ref 0.3–1.2)
Total Protein: 7 g/dL (ref 6.5–8.1)

## 2018-03-16 LAB — CBC
HCT: 41.8 % (ref 39.0–52.0)
Hemoglobin: 14.3 g/dL (ref 13.0–17.0)
MCH: 29.8 pg (ref 26.0–34.0)
MCHC: 34.2 g/dL (ref 30.0–36.0)
MCV: 87.1 fL (ref 78.0–100.0)
Platelets: 291 10*3/uL (ref 150–400)
RBC: 4.8 MIL/uL (ref 4.22–5.81)
RDW: 13.2 % (ref 11.5–15.5)
WBC: 15.6 10*3/uL — ABNORMAL HIGH (ref 4.0–10.5)

## 2018-03-16 LAB — URINALYSIS, ROUTINE W REFLEX MICROSCOPIC
Bilirubin Urine: NEGATIVE
Glucose, UA: NEGATIVE mg/dL
Hgb urine dipstick: NEGATIVE
Ketones, ur: NEGATIVE mg/dL
Leukocytes, UA: NEGATIVE
Nitrite: NEGATIVE
Protein, ur: NEGATIVE mg/dL
Specific Gravity, Urine: 1.018 (ref 1.005–1.030)
pH: 7 (ref 5.0–8.0)

## 2018-03-16 LAB — LIPASE, BLOOD: Lipase: 29 U/L (ref 11–51)

## 2018-03-16 MED ORDER — POTASSIUM CHLORIDE CRYS ER 20 MEQ PO TBCR
40.0000 meq | EXTENDED_RELEASE_TABLET | Freq: Once | ORAL | Status: AC
  Administered 2018-03-16: 40 meq via ORAL
  Filled 2018-03-16: qty 2

## 2018-03-16 MED ORDER — SODIUM CHLORIDE 0.9 % IV BOLUS (SEPSIS)
1000.0000 mL | Freq: Once | INTRAVENOUS | Status: AC
  Administered 2018-03-16: 1000 mL via INTRAVENOUS

## 2018-03-16 MED ORDER — ONDANSETRON HCL 4 MG/2ML IJ SOLN
4.0000 mg | Freq: Once | INTRAMUSCULAR | Status: AC | PRN
  Administered 2018-03-16: 4 mg via INTRAVENOUS
  Filled 2018-03-16: qty 2

## 2018-03-16 MED ORDER — SODIUM CHLORIDE 0.9 % IV BOLUS
1000.0000 mL | Freq: Once | INTRAVENOUS | Status: AC
  Administered 2018-03-16: 1000 mL via INTRAVENOUS

## 2018-03-16 MED ORDER — ONDANSETRON 8 MG PO TBDP: 8.0000 mg | ORAL_TABLET | Freq: Three times a day (TID) | ORAL | 0 refills | Status: DC | PRN

## 2018-03-16 MED ORDER — FAMOTIDINE IN NACL 20-0.9 MG/50ML-% IV SOLN
20.0000 mg | Freq: Once | INTRAVENOUS | Status: AC
  Administered 2018-03-16: 20 mg via INTRAVENOUS
  Filled 2018-03-16: qty 50

## 2018-03-16 MED ORDER — SODIUM CHLORIDE 0.9 % IV SOLN
1000.0000 mL | INTRAVENOUS | Status: DC
  Administered 2018-03-16: 1000 mL via INTRAVENOUS

## 2018-03-16 MED ORDER — FAMOTIDINE 20 MG PO TABS: 20.0000 mg | ORAL_TABLET | Freq: Two times a day (BID) | ORAL | 0 refills | Status: DC

## 2018-03-16 NOTE — ED Triage Notes (Signed)
Per [facility] pt from [employer] where he was about to eat lunch but felt bad, went outside and vomited. EMS reports that pt was hypotensive. Vitals: 160/90 flat 130 palpated sitting up. CBG 144 18G in left AC had NS 578ml in route.  Last BP 138/70 HR remained in 60s.

## 2018-03-16 NOTE — ED Provider Notes (Signed)
[facility] DEPT Provider Note   CSN: [id number] Arrival date & time: 03/16/18  1311     History   Chief Complaint Chief Complaint  Patient presents with  . Emesis  . orthostatic hypotension  . Abdominal Pain    HPI Chad Cole is a 47 y.o. male.  HPI Patient presents to the emergency room for evaluation of nausea vomiting and abdominal pain.  Patient states he started to have an upset stomach before he went out to eat at [facility].  He started to try and eat something when his symptoms worsened.  He went outside to his car and sat in the air conditioning.  He started to feel diaphoretic and lightheaded.  He felt tingling in his extremities.  Patient then vomited.  EMS was called.  Patient's vital signs initially were unremarkable.  Plan was for him to go to an urgent care for treatment.  However when the patient tried to stand up his blood pressure dropped 30 points to 099 systolic and he was brought to the emergency room for evaluation.  Patient is not feeling as bad as he was earlier but still does feel a tightness in his upper abdomen.  He denies any prior surgeries in his abdomen.  He denies any diarrhea or constipation.  No dysuria. Past Medical History:  Diagnosis Date  . Anxiety   . Cancer ([place]) 10/2012   malignant melanoma & basal cell  . Hypertension   . Migraine     Patient Active Problem List   Diagnosis Date Noted  . Chronic migraine without aura, not intractable 05/29/2016  . Panic attack 10/08/2013  . Anxiety 10/08/2013  . Left foot pain 08/24/2013  . History of migraine headaches 08/09/2013  . FH: CAD (coronary artery disease) 08/09/2013  . Lower leg edema 08/09/2013  . Right groin pain 08/03/2013  . Umbilical hernia 83/38/2505    Past Surgical History:  Procedure Laterality Date  . CERVICAL SPINE SURGERY    . GANGLION CYST EXCISION Left 2014   L foot w/ nerve entrapment and subsequent surgery  . KNEE SURGERY     bilateral.  . MELANOMA EXCISION     one inside right thigh, one on top of head and left cheek        Home Medications    Prior to Admission medications   Medication Sig Start Date End Date Taking? Authorizing Provider  ALPRAZolam Duanne Moron) 0.5 MG tablet Take 1 tablet (0.5 mg total) by mouth 3 (three) times daily as needed for anxiety. 10/08/13  Yes Pamella Pert, MD  hydrochlorothiazide (HYDRODIURIL) 25 MG tablet TAKE 1 TABLET BY MOUTH EVERY DAY 10/02/17  Yes Barrett, Evelene Croon, PA-C  Multiple Vitamin (MULTIVITAMIN WITH MINERALS) TABS tablet Take 1 tablet by mouth daily.   Yes [provider]  nadolol (CORGARD) 40 MG tablet Take 40 mg by mouth daily.  05/24/16  Yes [provider]  RELPAX 40 MG tablet Take 40 mg by mouth every 2 (two) hours as needed for migraine.  07/14/13  Yes [provider]  sertraline (ZOLOFT) 50 MG tablet Take 25 mg by mouth daily. 01/15/18  Yes [provider]  topiramate (TOPAMAX) 25 MG tablet Take 1 tablet po at bedtime for 1 week then increase to 2 tablets at bedtime Patient taking differently: Take 25 mg by mouth once a week. Wednesday 10/28/16  Yes Ward Givens, NP  famotidine (PEPCID) 20 MG tablet Take 1 tablet (20 mg total) by mouth 2 (two)  times daily. 03/16/18   Dorie Rank, MD  hydrochlorothiazide (HYDRODIURIL) 25 MG tablet Take 1 tablet (25 mg total) by mouth daily. Please call to schedule appt for further refills, thanks! Patient not taking: Reported on 03/16/2018 09/26/17   Barrett, Evelene Croon, PA-C  ondansetron (ZOFRAN ODT) 8 MG disintegrating tablet Take 1 tablet (8 mg total) by mouth every 8 (eight) hours as needed for nausea or vomiting. 03/16/18   Dorie Rank, MD    Family History Family History  Problem Relation Age of Onset  . COPD Mother   . Migraines Mother   . Heart attack Father 86       MI at 73, 56, & 44  . Hypertension Father   . COPD Father   . Hyperlipidemia Father   . Cancer Father        cancer in his  thumb really weird name pt says  . Bipolar disorder Brother   . Dementia Maternal Grandmother   . Diabetes Maternal Grandfather   . Heart disease Paternal Grandmother        bad heart valve  . Heart attack Paternal Grandfather   . Cancer - Lung Paternal Grandfather   . Cancer Paternal Grandfather        lung and leukemia  . Scoliosis Daughter     Social History Social History   Tobacco Use  . Smoking status: Never Smoker  . Smokeless tobacco: Never Used  Substance Use Topics  . Alcohol use: No  . Drug use: No     Allergies   Escitalopram and Lyrica [pregabalin]   Review of Systems Review of Systems  All other systems reviewed and are negative.    Physical Exam Updated Vital Signs BP 128/82   Pulse (!) 59   Temp 98.1 F (36.7 C) (Oral)   Resp (!) 30   SpO2 97%   Physical Exam  Constitutional: He appears well-developed and well-nourished. No distress.  HENT:  Head: Normocephalic and atraumatic.  Right Ear: External ear normal.  Left Ear: External ear normal.  Eyes: Conjunctivae are normal. Right eye exhibits no discharge. Left eye exhibits no discharge. No scleral icterus.  Neck: Neck supple. No tracheal deviation present.  Cardiovascular: Normal rate, regular rhythm and intact distal pulses.  Pulmonary/Chest: Effort normal and breath sounds normal. No stridor. No respiratory distress. He has no wheezes. He has no rales.  Abdominal: Soft. Bowel sounds are normal. He exhibits no distension. There is no tenderness. There is no rebound and no guarding.  Musculoskeletal: He exhibits no edema or tenderness.  Neurological: He is alert. He has normal strength. No cranial nerve deficit (no facial droop, extraocular movements intact, no slurred speech) or sensory deficit. He exhibits normal muscle tone. He displays no seizure activity. Coordination normal.  Skin: Skin is warm and dry. No rash noted.  Psychiatric: He has a normal mood and affect.  Nursing note and  vitals reviewed.    ED Treatments / Results  Labs (all labs ordered are listed, but only abnormal results are displayed) Labs Reviewed  COMPREHENSIVE METABOLIC PANEL - Abnormal; Notable for the following components:      Result Value   Potassium 3.3 (*)    Calcium 8.7 (*)    All other components within normal limits  CBC - Abnormal; Notable for the following components:   WBC 15.6 (*)    All other components within normal limits  LIPASE, BLOOD  URINALYSIS, ROUTINE W REFLEX MICROSCOPIC     Radiology US Abdomen  Complete  Result Date: 03/16/2018 CLINICAL DATA:  RIGHT upper quadrant pain. EXAM: ABDOMEN ULTRASOUND COMPLETE COMPARISON:  None. FINDINGS: Gallbladder: No gallstones or wall thickening visualized. No sonographic Murphy sign noted by sonographer. Common bile duct: Diameter: Normal measuring 3.3 mm. Liver: No focal lesion identified. Within normal limits in parenchymal echogenicity. Portal vein is patent on color Doppler imaging with normal direction of blood flow towards the liver. IVC: No abnormality visualized. Pancreas: Visualized portion unremarkable. Spleen: Size and appearance within normal limits. Right Kidney: Length: 10.6 cm. Echogenicity within normal limits. No mass or hydronephrosis visualized. Left Kidney: Length: 11.7 cm. Echogenicity within normal limits. No mass or hydronephrosis visualized. Abdominal aorta: No aneurysm visualized. Other findings: None. IMPRESSION: Negative exam. Electronically Signed   By: Staci Righter M.D.   On: 03/16/2018 20:39    Procedures Procedures (including critical care time)  Medications Ordered in ED Medications  ondansetron (ZOFRAN) injection 4 mg (4 mg Intravenous Given 03/16/18 1703)  sodium chloride 0.9 % bolus 1,000 mL (0 mLs Intravenous Stopped 03/16/18 1603)  sodium chloride 0.9 % bolus 1,000 mL (0 mLs Intravenous Stopped 03/16/18 1736)    Followed by  sodium chloride 0.9 % bolus 1,000 mL (0 mLs Intravenous Stopped 03/16/18  1736)  famotidine (PEPCID) IVPB 20 mg premix (0 mg Intravenous Stopped 03/16/18 1916)  potassium chloride SA (K-DUR,KLOR-CON) CR tablet 40 mEq (40 mEq Oral Given 03/16/18 1845)     Initial Impression / Assessment and Plan / ED Course  I have reviewed the triage vital signs and the nursing notes.  Pertinent labs & imaging results that were available during my care of the patient were reviewed by me and considered in my medical decision making (see chart for details).  Clinical Course as of Mar 17 1513  Tue Mar 17, 2018  1513 Labs reviewed.  Leukocytosis noted.  Ultrasound negative   [JK]    Clinical Course User Index [JK] Dorie Rank, MD   Patient presented to the emergency room with complaints of generalized upper abdominal discomfort.  Labs were notable for an elevation of his white blood cell count.  Abdominal ultrasound performed.  No evidence of cholecystitis or cholelithiasis.  Patient was treated with IV fluids and antacids.  His symptoms have improved.  Suspect GERD.  Dc home with outpatient follow up.  Return for worsening symptoms.  Final Clinical Impressions(s) / ED Diagnoses   Final diagnoses:  Epigastric abdominal pain    ED Discharge Orders        Ordered    famotidine (PEPCID) 20 MG tablet  2 times daily     03/16/18 2100    ondansetron (ZOFRAN ODT) 8 MG disintegrating tablet  Every 8 hours PRN     03/16/18 2100       Dorie Rank, MD 03/17/18 1515

## 2018-03-16 NOTE — Discharge Instructions (Signed)
Take the medications as prescribed, follow-up with your doctor later this week if the symptoms are not improving, return as needed for worsening symptoms

## 2018-06-08 DIAGNOSIS — K080 Exfoliation of teeth due to systemic causes: Secondary | ICD-10-CM | POA: Diagnosis not present

## 2018-07-02 DIAGNOSIS — M25519 Pain in unspecified shoulder: Secondary | ICD-10-CM | POA: Insufficient documentation

## 2018-07-02 DIAGNOSIS — M25512 Pain in left shoulder: Secondary | ICD-10-CM | POA: Diagnosis not present

## 2018-07-08 DIAGNOSIS — Z23 Encounter for immunization: Secondary | ICD-10-CM | POA: Diagnosis not present

## 2018-07-28 DIAGNOSIS — I10 Essential (primary) hypertension: Secondary | ICD-10-CM | POA: Diagnosis not present

## 2018-07-28 DIAGNOSIS — R6882 Decreased libido: Secondary | ICD-10-CM | POA: Diagnosis not present

## 2018-07-28 DIAGNOSIS — F419 Anxiety disorder, unspecified: Secondary | ICD-10-CM | POA: Diagnosis not present

## 2018-07-28 DIAGNOSIS — G43809 Other migraine, not intractable, without status migrainosus: Secondary | ICD-10-CM | POA: Diagnosis not present

## 2018-08-05 ENCOUNTER — Other Ambulatory Visit: Payer: Self-pay | Admitting: Physician Assistant

## 2018-08-05 DIAGNOSIS — M25512 Pain in left shoulder: Principal | ICD-10-CM

## 2018-08-05 DIAGNOSIS — G8929 Other chronic pain: Secondary | ICD-10-CM

## 2018-08-16 ENCOUNTER — Ambulatory Visit
Admission: RE | Admit: 2018-08-16 | Discharge: 2018-08-16 | Disposition: A | Discharge status: Home / Self Care | Payer: Federal, State, Local not specified - PPO | Source: Ambulatory Visit | Attending: Physician Assistant | Admitting: Physician Assistant

## 2018-08-16 DIAGNOSIS — M7522 Bicipital tendinitis, left shoulder: Secondary | ICD-10-CM | POA: Diagnosis not present

## 2018-08-16 DIAGNOSIS — G8929 Other chronic pain: Secondary | ICD-10-CM

## 2018-08-16 DIAGNOSIS — M25512 Pain in left shoulder: Principal | ICD-10-CM

## 2018-08-26 DIAGNOSIS — M542 Cervicalgia: Secondary | ICD-10-CM | POA: Diagnosis not present

## 2018-08-26 DIAGNOSIS — M25512 Pain in left shoulder: Secondary | ICD-10-CM | POA: Diagnosis not present

## 2018-08-27 DIAGNOSIS — M5412 Radiculopathy, cervical region: Secondary | ICD-10-CM | POA: Insufficient documentation

## 2018-09-02 DIAGNOSIS — M79675 Pain in left toe(s): Secondary | ICD-10-CM | POA: Diagnosis not present

## 2018-09-02 DIAGNOSIS — L600 Ingrowing nail: Secondary | ICD-10-CM | POA: Diagnosis not present

## 2018-09-02 DIAGNOSIS — M19072 Primary osteoarthritis, left ankle and foot: Secondary | ICD-10-CM | POA: Diagnosis not present

## 2018-09-04 DIAGNOSIS — M5412 Radiculopathy, cervical region: Secondary | ICD-10-CM | POA: Diagnosis not present

## 2018-09-24 DIAGNOSIS — Z85820 Personal history of malignant melanoma of skin: Secondary | ICD-10-CM | POA: Diagnosis not present

## 2018-09-24 DIAGNOSIS — L570 Actinic keratosis: Secondary | ICD-10-CM | POA: Diagnosis not present

## 2018-09-24 DIAGNOSIS — D2272 Melanocytic nevi of left lower limb, including hip: Secondary | ICD-10-CM | POA: Diagnosis not present

## 2018-09-24 DIAGNOSIS — D225 Melanocytic nevi of trunk: Secondary | ICD-10-CM | POA: Diagnosis not present

## 2018-09-24 DIAGNOSIS — Z86018 Personal history of other benign neoplasm: Secondary | ICD-10-CM | POA: Diagnosis not present

## 2018-09-24 DIAGNOSIS — Z85828 Personal history of other malignant neoplasm of skin: Secondary | ICD-10-CM | POA: Diagnosis not present

## 2018-09-24 DIAGNOSIS — D485 Neoplasm of uncertain behavior of skin: Secondary | ICD-10-CM | POA: Diagnosis not present

## 2018-09-24 DIAGNOSIS — M5412 Radiculopathy, cervical region: Secondary | ICD-10-CM | POA: Diagnosis not present

## 2018-09-29 DIAGNOSIS — M5412 Radiculopathy, cervical region: Secondary | ICD-10-CM | POA: Diagnosis not present

## 2018-09-29 DIAGNOSIS — M25512 Pain in left shoulder: Secondary | ICD-10-CM | POA: Diagnosis not present

## 2018-09-29 DIAGNOSIS — M542 Cervicalgia: Secondary | ICD-10-CM | POA: Diagnosis not present

## 2018-10-12 DIAGNOSIS — I10 Essential (primary) hypertension: Secondary | ICD-10-CM | POA: Insufficient documentation

## 2018-10-12 DIAGNOSIS — M542 Cervicalgia: Secondary | ICD-10-CM | POA: Diagnosis not present

## 2018-10-20 DIAGNOSIS — M542 Cervicalgia: Secondary | ICD-10-CM | POA: Diagnosis not present

## 2018-10-27 DIAGNOSIS — M5412 Radiculopathy, cervical region: Secondary | ICD-10-CM | POA: Diagnosis not present

## 2018-10-27 DIAGNOSIS — M542 Cervicalgia: Secondary | ICD-10-CM | POA: Diagnosis not present

## 2018-11-09 DIAGNOSIS — R202 Paresthesia of skin: Secondary | ICD-10-CM | POA: Diagnosis not present

## 2018-11-09 DIAGNOSIS — M5412 Radiculopathy, cervical region: Secondary | ICD-10-CM | POA: Diagnosis not present

## 2018-11-13 DIAGNOSIS — M5412 Radiculopathy, cervical region: Secondary | ICD-10-CM | POA: Diagnosis not present

## 2018-11-13 DIAGNOSIS — M542 Cervicalgia: Secondary | ICD-10-CM | POA: Diagnosis not present

## 2018-12-09 DIAGNOSIS — K080 Exfoliation of teeth due to systemic causes: Secondary | ICD-10-CM | POA: Diagnosis not present

## 2018-12-09 DIAGNOSIS — M25512 Pain in left shoulder: Secondary | ICD-10-CM | POA: Diagnosis not present

## 2019-01-28 DIAGNOSIS — F419 Anxiety disorder, unspecified: Secondary | ICD-10-CM | POA: Diagnosis not present

## 2019-01-28 DIAGNOSIS — G43809 Other migraine, not intractable, without status migrainosus: Secondary | ICD-10-CM | POA: Diagnosis not present

## 2019-01-28 DIAGNOSIS — Z85820 Personal history of malignant melanoma of skin: Secondary | ICD-10-CM | POA: Diagnosis not present

## 2019-01-28 DIAGNOSIS — M25512 Pain in left shoulder: Secondary | ICD-10-CM | POA: Diagnosis not present

## 2019-01-28 DIAGNOSIS — I10 Essential (primary) hypertension: Secondary | ICD-10-CM | POA: Diagnosis not present

## 2019-02-05 DIAGNOSIS — I10 Essential (primary) hypertension: Secondary | ICD-10-CM | POA: Diagnosis not present

## 2019-02-05 DIAGNOSIS — Z125 Encounter for screening for malignant neoplasm of prostate: Secondary | ICD-10-CM | POA: Diagnosis not present

## 2019-02-05 DIAGNOSIS — R6882 Decreased libido: Secondary | ICD-10-CM | POA: Diagnosis not present

## 2019-02-09 DIAGNOSIS — M25512 Pain in left shoulder: Secondary | ICD-10-CM | POA: Diagnosis not present

## 2019-02-18 DIAGNOSIS — M25512 Pain in left shoulder: Secondary | ICD-10-CM | POA: Diagnosis not present

## 2019-03-08 DIAGNOSIS — S90862A Insect bite (nonvenomous), left foot, initial encounter: Secondary | ICD-10-CM | POA: Diagnosis not present

## 2019-03-08 DIAGNOSIS — M7989 Other specified soft tissue disorders: Secondary | ICD-10-CM | POA: Diagnosis not present

## 2019-03-08 DIAGNOSIS — T7840XA Allergy, unspecified, initial encounter: Secondary | ICD-10-CM | POA: Diagnosis not present

## 2019-04-06 DIAGNOSIS — Z86018 Personal history of other benign neoplasm: Secondary | ICD-10-CM | POA: Diagnosis not present

## 2019-04-06 DIAGNOSIS — D485 Neoplasm of uncertain behavior of skin: Secondary | ICD-10-CM | POA: Diagnosis not present

## 2019-04-06 DIAGNOSIS — Z85828 Personal history of other malignant neoplasm of skin: Secondary | ICD-10-CM | POA: Diagnosis not present

## 2019-04-06 DIAGNOSIS — D225 Melanocytic nevi of trunk: Secondary | ICD-10-CM | POA: Diagnosis not present

## 2019-04-06 DIAGNOSIS — Z85820 Personal history of malignant melanoma of skin: Secondary | ICD-10-CM | POA: Diagnosis not present

## 2019-04-08 DIAGNOSIS — M5412 Radiculopathy, cervical region: Secondary | ICD-10-CM | POA: Diagnosis not present

## 2019-04-23 DIAGNOSIS — M542 Cervicalgia: Secondary | ICD-10-CM | POA: Diagnosis not present

## 2019-04-23 DIAGNOSIS — M5412 Radiculopathy, cervical region: Secondary | ICD-10-CM | POA: Diagnosis not present

## 2019-04-28 ENCOUNTER — Telehealth: Payer: Self-pay | Admitting: Nurse Practitioner

## 2019-04-28 ENCOUNTER — Other Ambulatory Visit: Payer: Self-pay | Admitting: Orthopedic Surgery

## 2019-04-28 DIAGNOSIS — M5412 Radiculopathy, cervical region: Secondary | ICD-10-CM

## 2019-04-28 NOTE — Telephone Encounter (Signed)
Phone call to patient to verify medication list and allergies for myelogram procedure. Pt instructed to hold Relpax and Zoloft for 48hrs prior to myelogram appointment time. Pt verbalized understanding. Pre and post procedure instructions reviewed with pt.

## 2019-05-10 ENCOUNTER — Other Ambulatory Visit: Payer: Federal, State, Local not specified - PPO

## 2019-05-10 ENCOUNTER — Inpatient Hospital Stay
Admission: RE | Admit: 2019-05-10 | Discharge: 2019-05-10 | Disposition: A | Discharge status: Home / Self Care | Payer: Federal, State, Local not specified - PPO | Source: Ambulatory Visit | Attending: Orthopedic Surgery | Admitting: Orthopedic Surgery

## 2019-05-10 NOTE — Discharge Instructions (Signed)
Myelogram Discharge Instructions  1. Go home and rest quietly for the next 24 hours.  It is important to lie flat for the next 24 hours.  Get up only to go to the restroom.  You may lie in the bed or on a couch on your back, your stomach, your left side or your right side.  You may have one pillow under your head.  You may have pillows between your knees while you are on your side or under your knees while you are on your back.  2. DO NOT drive today.  Recline the seat as far back as it will go, while still wearing your seat belt, on the way home.  3. You may get up to go to the bathroom as needed.  You may sit up for 10 minutes to eat.  You may resume your normal diet and medications unless otherwise indicated.  Drink lots of extra fluids today and tomorrow.  4. The incidence of headache, nausea, or vomiting is about 5% (one in 20 patients).  If you develop a headache, lie flat and drink plenty of fluids until the headache goes away.  Caffeinated beverages may be helpful.  If you develop severe nausea and vomiting or a headache that does not go away with flat bed rest, call [phone].  5. You may resume normal activities after your 24 hours of bed rest is over; however, do not exert yourself strongly or do any heavy lifting tomorrow. If when you get up you have a headache when standing, go back to bed and force fluids for another 24 hours.  6. Call your physician for a follow-up appointment.  The results of your myelogram will be sent directly to your physician by the following day.  7. If you have any questions or if complications develop after you arrive home, please call [phone].  Discharge instructions have been explained to the patient.  The patient, or the person responsible for the patient, fully understands these instructions.  YOU MAY RESTART YOUR RELPAX AND ZOLOFT TOMORROW 05/11/2019 AT 09:30AM.

## 2019-05-12 ENCOUNTER — Ambulatory Visit
Admission: RE | Admit: 2019-05-12 | Discharge: 2019-05-12 | Disposition: A | Discharge status: Home / Self Care | Payer: Federal, State, Local not specified - PPO | Source: Ambulatory Visit | Attending: Orthopedic Surgery | Admitting: Orthopedic Surgery

## 2019-05-12 ENCOUNTER — Other Ambulatory Visit: Payer: Self-pay

## 2019-05-12 DIAGNOSIS — M5412 Radiculopathy, cervical region: Secondary | ICD-10-CM

## 2019-05-12 DIAGNOSIS — M542 Cervicalgia: Secondary | ICD-10-CM | POA: Diagnosis not present

## 2019-05-12 DIAGNOSIS — C4371 Malignant melanoma of right lower limb, including hip: Secondary | ICD-10-CM | POA: Insufficient documentation

## 2019-05-12 MED ORDER — MEPERIDINE HCL 100 MG/ML IJ SOLN
75.0000 mg | Freq: Once | INTRAMUSCULAR | Status: AC
  Administered 2019-05-12: 75 mg via INTRAMUSCULAR

## 2019-05-12 MED ORDER — DIAZEPAM 5 MG PO TABS
10.0000 mg | ORAL_TABLET | Freq: Once | ORAL | Status: AC
  Administered 2019-05-12: 10:00:00 10 mg via ORAL

## 2019-05-12 MED ORDER — IOPAMIDOL (ISOVUE-M 200) INJECTION 41%: 10.0000 mL | Freq: Once | INTRAMUSCULAR | Status: DC

## 2019-05-12 MED ORDER — HYDROXYZINE HCL 50 MG/ML IM SOLN
25.0000 mg | Freq: Once | INTRAMUSCULAR | Status: AC
  Administered 2019-05-12: 25 mg via INTRAMUSCULAR

## 2019-05-12 NOTE — Discharge Instructions (Signed)
Myelogram Discharge Instructions  1. Go home and rest quietly for the next 24 hours.  It is important to lie flat for the next 24 hours.  Get up only to go to the restroom.  You may lie in the bed or on a couch on your back, your stomach, your left side or your right side.  You may have one pillow under your head.  You may have pillows between your knees while you are on your side or under your knees while you are on your back.  2. DO NOT drive today.  Recline the seat as far back as it will go, while still wearing your seat belt, on the way home.  3. You may get up to go to the bathroom as needed.  You may sit up for 10 minutes to eat.  You may resume your normal diet and medications unless otherwise indicated.  Drink plenty of extra fluids today and tomorrow.  4. The incidence of a spinal headache with nausea and/or vomiting is about 5% (one in 20 patients).  If you develop a headache, lie flat and drink plenty of fluids until the headache goes away.  Caffeinated beverages may be helpful.  If you develop severe nausea and vomiting or a headache that does not go away with flat bed rest, call [phone].  5. You may resume normal activities after your 24 hours of bed rest is over; however, do not exert yourself strongly or do any heavy lifting tomorrow.  6. Call your physician for a follow-up appointment.    You may resume Relpax and Zoloft on Thursday, May 13, 2019 after 10:30a.m.

## 2019-05-14 ENCOUNTER — Ambulatory Visit
Admission: RE | Admit: 2019-05-14 | Discharge: 2019-05-14 | Disposition: A | Discharge status: Home / Self Care | Payer: Federal, State, Local not specified - PPO | Source: Ambulatory Visit | Attending: Orthopedic Surgery | Admitting: Orthopedic Surgery

## 2019-05-14 ENCOUNTER — Other Ambulatory Visit: Payer: Self-pay | Admitting: Orthopedic Surgery

## 2019-05-14 DIAGNOSIS — G971 Other reaction to spinal and lumbar puncture: Secondary | ICD-10-CM

## 2019-05-14 MED ORDER — METHYLPREDNISOLONE ACETATE 40 MG/ML INJ SUSP (RADIOLOG
120.0000 mg | Freq: Once | INTRAMUSCULAR | Status: AC
  Administered 2019-05-14: 120 mg via EPIDURAL

## 2019-05-14 MED ORDER — IOPAMIDOL (ISOVUE-M 200) INJECTION 41%
1.0000 mL | Freq: Once | INTRAMUSCULAR | Status: AC
  Administered 2019-05-14: 1 mL via EPIDURAL

## 2019-05-14 NOTE — Discharge Instructions (Signed)

## 2019-05-17 DIAGNOSIS — M5412 Radiculopathy, cervical region: Secondary | ICD-10-CM | POA: Diagnosis not present

## 2019-07-21 DIAGNOSIS — M542 Cervicalgia: Secondary | ICD-10-CM | POA: Diagnosis not present

## 2019-07-21 DIAGNOSIS — M5412 Radiculopathy, cervical region: Secondary | ICD-10-CM | POA: Diagnosis not present

## 2019-07-21 DIAGNOSIS — M19019 Primary osteoarthritis, unspecified shoulder: Secondary | ICD-10-CM | POA: Diagnosis not present

## 2019-07-21 DIAGNOSIS — M50223 Other cervical disc displacement at C6-C7 level: Secondary | ICD-10-CM | POA: Diagnosis not present

## 2019-08-02 DIAGNOSIS — F419 Anxiety disorder, unspecified: Secondary | ICD-10-CM | POA: Diagnosis not present

## 2019-08-02 DIAGNOSIS — I10 Essential (primary) hypertension: Secondary | ICD-10-CM | POA: Diagnosis not present

## 2019-08-02 DIAGNOSIS — G43809 Other migraine, not intractable, without status migrainosus: Secondary | ICD-10-CM | POA: Diagnosis not present

## 2019-08-02 DIAGNOSIS — M5412 Radiculopathy, cervical region: Secondary | ICD-10-CM | POA: Diagnosis not present

## 2019-09-01 DIAGNOSIS — M5412 Radiculopathy, cervical region: Secondary | ICD-10-CM | POA: Diagnosis not present

## 2019-09-01 DIAGNOSIS — M542 Cervicalgia: Secondary | ICD-10-CM | POA: Diagnosis not present

## 2019-09-01 DIAGNOSIS — M19019 Primary osteoarthritis, unspecified shoulder: Secondary | ICD-10-CM | POA: Diagnosis not present

## 2019-09-01 DIAGNOSIS — M50223 Other cervical disc displacement at C6-C7 level: Secondary | ICD-10-CM | POA: Diagnosis not present

## 2019-12-20 DIAGNOSIS — M50223 Other cervical disc displacement at C6-C7 level: Secondary | ICD-10-CM | POA: Diagnosis not present

## 2019-12-20 DIAGNOSIS — M5412 Radiculopathy, cervical region: Secondary | ICD-10-CM | POA: Diagnosis not present

## 2019-12-20 DIAGNOSIS — M542 Cervicalgia: Secondary | ICD-10-CM | POA: Diagnosis not present

## 2020-01-25 DIAGNOSIS — Z0000 Encounter for general adult medical examination without abnormal findings: Secondary | ICD-10-CM | POA: Diagnosis not present

## 2020-01-25 DIAGNOSIS — I10 Essential (primary) hypertension: Secondary | ICD-10-CM | POA: Diagnosis not present

## 2020-01-25 DIAGNOSIS — J301 Allergic rhinitis due to pollen: Secondary | ICD-10-CM | POA: Diagnosis not present

## 2020-01-25 DIAGNOSIS — R5383 Other fatigue: Secondary | ICD-10-CM | POA: Diagnosis not present

## 2020-01-25 DIAGNOSIS — M5412 Radiculopathy, cervical region: Secondary | ICD-10-CM | POA: Diagnosis not present

## 2020-01-27 ENCOUNTER — Encounter (HOSPITAL_BASED_OUTPATIENT_CLINIC_OR_DEPARTMENT_OTHER): Payer: Self-pay

## 2020-01-27 ENCOUNTER — Other Ambulatory Visit: Payer: Self-pay

## 2020-01-27 ENCOUNTER — Emergency Department (HOSPITAL_BASED_OUTPATIENT_CLINIC_OR_DEPARTMENT_OTHER)
Admission: EM | Admit: 2020-01-27 | Discharge: 2020-01-27 | Disposition: A | Discharge status: Home / Self Care | Payer: Federal, State, Local not specified - PPO | Attending: Emergency Medicine | Admitting: Emergency Medicine

## 2020-01-27 DIAGNOSIS — Z79899 Other long term (current) drug therapy: Secondary | ICD-10-CM | POA: Diagnosis not present

## 2020-01-27 DIAGNOSIS — R519 Headache, unspecified: Secondary | ICD-10-CM | POA: Diagnosis not present

## 2020-01-27 DIAGNOSIS — Z888 Allergy status to other drugs, medicaments and biological substances status: Secondary | ICD-10-CM | POA: Insufficient documentation

## 2020-01-27 DIAGNOSIS — Z85820 Personal history of malignant melanoma of skin: Secondary | ICD-10-CM | POA: Insufficient documentation

## 2020-01-27 DIAGNOSIS — G44209 Tension-type headache, unspecified, not intractable: Secondary | ICD-10-CM | POA: Diagnosis not present

## 2020-01-27 DIAGNOSIS — I10 Essential (primary) hypertension: Secondary | ICD-10-CM | POA: Insufficient documentation

## 2020-01-27 DIAGNOSIS — G43909 Migraine, unspecified, not intractable, without status migrainosus: Secondary | ICD-10-CM | POA: Diagnosis not present

## 2020-01-27 LAB — CBC WITH DIFFERENTIAL/PLATELET
Abs Immature Granulocytes: 0.01 10*3/uL (ref 0.00–0.07)
Basophils Absolute: 0 10*3/uL (ref 0.0–0.1)
Basophils Relative: 0 %
Eosinophils Absolute: 0.1 10*3/uL (ref 0.0–0.5)
Eosinophils Relative: 1 %
HCT: 46.9 % (ref 39.0–52.0)
Hemoglobin: 16 g/dL (ref 13.0–17.0)
Immature Granulocytes: 0 %
Lymphocytes Relative: 17 %
Lymphs Abs: 1.2 10*3/uL (ref 0.7–4.0)
MCH: 29.1 pg (ref 26.0–34.0)
MCHC: 34.1 g/dL (ref 30.0–36.0)
MCV: 85.4 fL (ref 80.0–100.0)
Monocytes Absolute: 0.6 10*3/uL (ref 0.1–1.0)
Monocytes Relative: 7 %
Neutro Abs: 5.6 10*3/uL (ref 1.7–7.7)
Neutrophils Relative %: 75 %
Platelets: 257 10*3/uL (ref 150–400)
RBC: 5.49 MIL/uL (ref 4.22–5.81)
RDW: 12.6 % (ref 11.5–15.5)
WBC: 7.5 10*3/uL (ref 4.0–10.5)
nRBC: 0 % (ref 0.0–0.2)

## 2020-01-27 LAB — COMPREHENSIVE METABOLIC PANEL
ALT: 24 U/L (ref 0–44)
AST: 23 U/L (ref 15–41)
Albumin: 4.5 g/dL (ref 3.5–5.0)
Alkaline Phosphatase: 87 U/L (ref 38–126)
Anion gap: 11 (ref 5–15)
BUN: 17 mg/dL (ref 6–20)
CO2: 24 mmol/L (ref 22–32)
Calcium: 8.8 mg/dL — ABNORMAL LOW (ref 8.9–10.3)
Chloride: 104 mmol/L (ref 98–111)
Creatinine, Ser: 0.93 mg/dL (ref 0.61–1.24)
GFR calc Af Amer: >60 mL/min (ref >60)
GFR calc non Af Amer: >60 mL/min (ref >60)
Glucose, Bld: 107 mg/dL — ABNORMAL HIGH (ref 70–99)
Potassium: 3.9 mmol/L (ref 3.5–5.1)
Sodium: 139 mmol/L (ref 135–145)
Total Bilirubin: 1.1 mg/dL (ref 0.3–1.2)
Total Protein: 7.6 g/dL (ref 6.5–8.1)

## 2020-01-27 MED ORDER — KETOROLAC TROMETHAMINE 15 MG/ML IJ SOLN
15.0000 mg | Freq: Once | INTRAMUSCULAR | Status: AC
  Administered 2020-01-27: 15 mg via INTRAVENOUS
  Filled 2020-01-27: qty 1

## 2020-01-27 MED ORDER — SODIUM CHLORIDE 0.9 % IV BOLUS
500.0000 mL | Freq: Once | INTRAVENOUS | Status: AC
  Administered 2020-01-27: 500 mL via INTRAVENOUS

## 2020-01-27 MED ORDER — METOCLOPRAMIDE HCL 5 MG/ML IJ SOLN
10.0000 mg | Freq: Once | INTRAMUSCULAR | Status: AC
  Administered 2020-01-27: 10 mg via INTRAVENOUS
  Filled 2020-01-27: qty 2

## 2020-01-27 MED ORDER — DIPHENHYDRAMINE HCL 50 MG/ML IJ SOLN
50.0000 mg | Freq: Once | INTRAMUSCULAR | Status: AC
  Administered 2020-01-27: 50 mg via INTRAVENOUS
  Filled 2020-01-27: qty 1

## 2020-01-27 NOTE — ED Provider Notes (Signed)
[name] EMERGENCY DEPARTMENT Provider Note   CSN: [id number] Arrival date & time: 01/27/20  1309     History Chief Complaint  Patient presents with  . Migraine    Chad Cole is a 49 y.o. male history of stage 1 melanoma in remission x 5 years, hypertension, anxiety, migraine.  Presents for headache, onset 5:15am, throbbing/squeeze bilateral frontal headache constant moderate-severe, occasionally radiates to back of head, worse with light and sound, no improvement with Eletriptan x 3. Associated with nbnb emesis pta.  Reports similar to previous migraines but longer in duration, no abnormal features.  Denies headache, vision changes, neck stiffness, fever/chills, sore throat, recent illness, abdominal pain, chest pain, sob, cough, numbness, weakness, tingling or any additional concerns.  HPI     Past Medical History:  Diagnosis Date  . Anxiety   . Cancer ([place]) 10/2012   malignant melanoma & basal cell  . Hypertension   . Migraine     Patient Active Problem List   Diagnosis Date Noted  . Malignant melanoma of right thigh ([place]) 05/12/2019  . Hypertensive disorder 10/12/2018  . Cervical radiculopathy 08/27/2018  . Shoulder pain 07/02/2018  . Chronic migraine without aura, not intractable 05/29/2016  . Panic attack 10/08/2013  . Anxiety 10/08/2013  . Left foot pain 08/24/2013  . History of migraine headaches 08/09/2013  . FH: CAD (coronary artery disease) 08/09/2013  . Lower leg edema 08/09/2013  . Right groin pain 08/03/2013  . Umbilical hernia [id number]    Past Surgical History:  Procedure Laterality Date  . CERVICAL SPINE SURGERY    . GANGLION CYST EXCISION Left 2014   L foot w/ nerve entrapment and subsequent surgery  . KNEE SURGERY     bilateral.  . MELANOMA EXCISION     one inside right thigh, one on top of head and left cheek       Family History  Problem Relation Age of Onset  . COPD Mother   . Migraines Mother   . Heart attack  Father 13       MI at 87, 31, & 74  . Hypertension Father   . COPD Father   . Hyperlipidemia Father   . Cancer Father        cancer in his thumb really weird name pt says  . Bipolar disorder Brother   . Dementia Maternal Grandmother   . Diabetes Maternal Grandfather   . Heart disease Paternal Grandmother        bad heart valve  . Heart attack Paternal Grandfather   . Cancer - Lung Paternal Grandfather   . Cancer Paternal Grandfather        lung and leukemia  . Scoliosis Daughter     Social History   Tobacco Use  . Smoking status: Never Smoker  . Smokeless tobacco: Never Used  Substance Use Topics  . Alcohol use: No  . Drug use: No    Home Medications Prior to Admission medications   Medication Sig Start Date End Date Taking? Authorizing Provider  ALPRAZolam Duanne Moron) 0.5 MG tablet Take 1 tablet (0.5 mg total) by mouth 3 (three) times daily as needed for anxiety. 10/08/13   Pamella Pert, MD  hydrochlorothiazide (HYDRODIURIL) 25 MG tablet TAKE 1 TABLET BY MOUTH EVERY DAY 10/02/17   Barrett, Evelene Croon, PA-C  Multiple Vitamin (MULTIVITAMIN WITH MINERALS) TABS tablet Take 1 tablet by mouth daily.    [provider]  nadolol (CORGARD) 40 MG tablet Take 40 mg by mouth  daily.  05/24/16   [provider]  RELPAX 40 MG tablet Take 40 mg by mouth every 2 (two) hours as needed for migraine.  07/14/13   [provider]  sertraline (ZOLOFT) 50 MG tablet Take 25 mg by mouth daily. 01/15/18   [provider]  topiramate (TOPAMAX) 25 MG tablet Take 1 tablet po at bedtime for 1 week then increase to 2 tablets at bedtime Patient taking differently: Take 25 mg by mouth once a week. Wednesday 10/28/16   Ward Givens, NP    Allergies    Escitalopram and Lyrica [pregabalin]  Review of Systems   Review of Systems Ten systems are reviewed and are negative for acute change except as noted in the HPI   Physical Exam Updated Vital Signs BP (!) 156/103 (BP  Location: Left Arm)   Pulse 60   Temp 97.8 F (36.6 C) (Oral)   Resp 18   Ht 5\' 7"  (1.702 m)   Wt 85.7 kg   SpO2 100%   BMI 29.60 kg/m   Physical Exam Constitutional:      General: He is not in acute distress.    Appearance: Normal appearance. He is well-developed. He is not ill-appearing or diaphoretic.  HENT:     Head: Normocephalic and atraumatic.     Right Ear: External ear normal.     Left Ear: External ear normal.     Nose: Nose normal.     Mouth/Throat:     Mouth: Mucous membranes are moist.     Pharynx: Oropharynx is clear.  Eyes:     General: Vision grossly intact. Gaze aligned appropriately.     Pupils: Pupils are equal, round, and reactive to light.  Neck:     Trachea: Trachea and phonation normal. No tracheal deviation.     Meningeal: Brudzinski's sign absent.  Pulmonary:     Effort: Pulmonary effort is normal. No respiratory distress.  Abdominal:     General: There is no distension.     Palpations: Abdomen is soft.     Tenderness: There is no abdominal tenderness. There is no guarding or rebound.  Musculoskeletal:        General: Normal range of motion.     Cervical back: Full passive range of motion without pain, normal range of motion and neck supple.  Skin:    General: Skin is warm and dry.  Neurological:     Mental Status: He is alert.     GCS: GCS eye subscore is 4. GCS verbal subscore is 5. GCS motor subscore is 6.     Comments: Speech is clear and goal oriented, follows commands Major Cranial nerves without deficit, no facial droop Normal strength in upper and lower extremities bilaterally including dorsiflexion and plantar flexion, strong and equal grip strength Sensation normal to light and sharp touch Moves extremities without ataxia, coordination intact Normal finger to nose and rapid alternating movements Neg romberg, no pronator drift Normal gait Normal heel-shin and balance  Psychiatric:        Behavior: Behavior normal.     ED  Results / Procedures / Treatments   Labs (all labs ordered are listed, but only abnormal results are displayed) Labs Reviewed  COMPREHENSIVE METABOLIC PANEL - Abnormal; Notable for the following components:      Result Value   Glucose, Bld 107 (*)    Calcium 8.8 (*)    All other components within normal limits  CBC WITH DIFFERENTIAL/PLATELET    EKG None  Radiology No results found.  Procedures Procedures (including critical care time)  Medications Ordered in ED Medications  metoCLOPramide (REGLAN) injection 10 mg (10 mg Intravenous Given 01/27/20 1454)  diphenhydrAMINE (BENADRYL) injection 50 mg (50 mg Intravenous Given 01/27/20 1451)  sodium chloride 0.9 % bolus 500 mL (500 mLs Intravenous New Bag/Given 01/27/20 1450)  ketorolac (TORADOL) 15 MG/ML injection 15 mg (15 mg Intravenous Given 01/27/20 1452)    ED Course  I have reviewed the triage vital signs and the nursing notes.  Pertinent labs & imaging results that were available during my care of the patient were reviewed by me and considered in my medical decision making (see chart for details).    MDM Rules/Calculators/A&P                     Chart Review: Review of [facility] General Surgery visit from 02/27/2018, visit for Malignant melanoma of leg.  ASSESSMENT and PLAN:  49 year old male s/p Stage IA(T1aN0M0) Right thigh melanoma s/p WLE/SLNB on 12/28/12 with NED on current physical exam or history. Agree with continued regular visits with his dermatologist. It has been 5 years since his resection. He can follow up in this clinic as needed. ===================================  MARTAVIS LUNDER is a 49 y.o. male who presents to ED for Bilateral frontal headache onset this morning. Patient states that their headache is consistent with their typical headache in the past. Patient without red-flag symptoms and reassuring exam.  Will treat with reglan, benadryl, toradol and iv fluids. Will check screening labs given n/v earlier, CBC  and CMP. Will reassess.  I discussed patient's previous history of Melanoma. I offered patient CT head today for evaluation given his headaches and no previous imaging seen in system. Patient has requested to defer decision until after migraine cocktail. ------------ I have reviewed and interpreted the following labs. CBC within normal limits. CMP shows no emergent electrolyte derangement, evidence of kidney injury or acute elevation of LFTs.  3:45 PM: Patient re-evaluated, resting comfortably, no acute distress. Reports headache improved and is requesting discharge. I rediscussed and again offered CT head today and patient refused. I expressed my thoughts regarding melanoma metastasis with the patient. Patient has the mental capacity to makes his own medical decisions. He has follow-up with dermatology in July and will see his PCP sooner for ER follow-up visit. Feel this is a resonable decision, given headache is consistent with prior doubt mass effect at this time. Additionally no evidence for meningitis, hemorrhage, sinusitis, CVA, venus dural thrombosis, arteritis or other emergent processes at this time.   At this time there does not appear to be any evidence of an acute emergency medical condition and the patient appears stable for discharge with appropriate outpatient follow up. Diagnosis was discussed with patient who verbalizes understanding of care plan and is agreeable to discharge. I have discussed return precautions with patient who verbalizes understanding. Patient encouraged to follow-up with their PCP. All questions answered.  Patient's case discussed with Dr. Wilson Singer who agrees with plan to discharge with follow-up.   Note: Portions of this report may have been transcribed using voice recognition software. Every effort was made to ensure accuracy; however, inadvertent computerized transcription errors may still be present.  Final Clinical Impression(s) / ED Diagnoses Final diagnoses:   Nonintractable headache, unspecified chronicity pattern, unspecified headache type    Rx / DC Orders ED Discharge Orders    None       Deliah Boston, Hershal Coria 01/27/20  1610    Virgel Manifold, MD 01/28/20 1815

## 2020-01-27 NOTE — Discharge Instructions (Addendum)
You have been diagnosed today with Headache.  At this time there does not appear to be the presence of an emergent medical condition, however there is always the potential for conditions to change. Please read and follow the below instructions.  Please return to the Emergency Department immediately for any new or worsening symptoms. Please be sure to follow up with your Primary Care Provider within one week regarding your visit today; please call their office to schedule an appointment even if you are feeling better for a follow-up visit. Please drink enough water to avoid dehydration and get plenty of rest. You have been given an NSAID-containing medication called Toradol today.  Do not take the medications including ibuprofen, Aleve, Advil, naproxen or other NSAID-containing medications for the next 2 days.  Please be sure to drink plenty of water over the next few days.  Get help right away if: Your headache gets very bad quickly. You have fever or chills. Your headache gets worse after a lot of physical activity. You keep throwing up. You have a stiff neck. You have trouble seeing. You have trouble speaking. You have pain in the eye or ear. Your muscles are weak or you lose muscle control. You lose your balance or have trouble walking. You feel like you will pass out (faint) or you pass out. You are mixed up (confused). You have a seizure. You have any new/concerning or worsening of symptoms   Please read the additional information packets attached to your discharge summary.  Do not take your medicine if  develop an itchy rash, swelling in your mouth or lips, or difficulty breathing; call 911 and seek immediate emergency medical attention if this occurs.  Note: Portions of this text may have been transcribed using voice recognition software. Every effort was made to ensure accuracy; however, inadvertent computerized transcription errors may still be present.

## 2020-01-27 NOTE — ED Triage Notes (Addendum)
Pt c/o migraine, n/v started 515am-denies fever/flu sx-no relief with home meds-NAD-steady gait

## 2020-02-14 DIAGNOSIS — F419 Anxiety disorder, unspecified: Secondary | ICD-10-CM | POA: Diagnosis not present

## 2020-02-14 DIAGNOSIS — I10 Essential (primary) hypertension: Secondary | ICD-10-CM | POA: Diagnosis not present

## 2020-04-16 DIAGNOSIS — S61217A Laceration without foreign body of left little finger without damage to nail, initial encounter: Secondary | ICD-10-CM | POA: Diagnosis not present

## 2020-04-16 DIAGNOSIS — W272XXA Contact with scissors, initial encounter: Secondary | ICD-10-CM | POA: Diagnosis not present

## 2020-04-18 DIAGNOSIS — L578 Other skin changes due to chronic exposure to nonionizing radiation: Secondary | ICD-10-CM | POA: Diagnosis not present

## 2020-04-18 DIAGNOSIS — L821 Other seborrheic keratosis: Secondary | ICD-10-CM | POA: Diagnosis not present

## 2020-04-18 DIAGNOSIS — D225 Melanocytic nevi of trunk: Secondary | ICD-10-CM | POA: Diagnosis not present

## 2020-04-18 DIAGNOSIS — L570 Actinic keratosis: Secondary | ICD-10-CM | POA: Diagnosis not present

## 2020-04-24 DIAGNOSIS — S61217D Laceration without foreign body of left little finger without damage to nail, subsequent encounter: Secondary | ICD-10-CM | POA: Diagnosis not present

## 2020-04-26 DIAGNOSIS — M542 Cervicalgia: Secondary | ICD-10-CM | POA: Diagnosis not present

## 2020-04-26 DIAGNOSIS — M5412 Radiculopathy, cervical region: Secondary | ICD-10-CM | POA: Diagnosis not present

## 2020-04-26 DIAGNOSIS — Z6830 Body mass index (BMI) 30.0-30.9, adult: Secondary | ICD-10-CM | POA: Diagnosis not present

## 2020-04-26 DIAGNOSIS — M50223 Other cervical disc displacement at C6-C7 level: Secondary | ICD-10-CM | POA: Diagnosis not present

## 2020-06-06 DIAGNOSIS — R0981 Nasal congestion: Secondary | ICD-10-CM | POA: Diagnosis not present

## 2020-06-06 DIAGNOSIS — B349 Viral infection, unspecified: Secondary | ICD-10-CM | POA: Diagnosis not present

## 2020-06-06 DIAGNOSIS — J029 Acute pharyngitis, unspecified: Secondary | ICD-10-CM | POA: Diagnosis not present

## 2020-06-06 DIAGNOSIS — R52 Pain, unspecified: Secondary | ICD-10-CM | POA: Diagnosis not present

## 2020-06-14 DIAGNOSIS — Z9889 Other specified postprocedural states: Secondary | ICD-10-CM | POA: Diagnosis not present

## 2020-06-14 DIAGNOSIS — Z6830 Body mass index (BMI) 30.0-30.9, adult: Secondary | ICD-10-CM | POA: Diagnosis not present

## 2020-06-14 DIAGNOSIS — I10 Essential (primary) hypertension: Secondary | ICD-10-CM | POA: Diagnosis not present

## 2020-06-14 DIAGNOSIS — M5412 Radiculopathy, cervical region: Secondary | ICD-10-CM | POA: Diagnosis not present

## 2020-07-07 DIAGNOSIS — Z9889 Other specified postprocedural states: Secondary | ICD-10-CM | POA: Diagnosis not present

## 2020-07-07 DIAGNOSIS — M5412 Radiculopathy, cervical region: Secondary | ICD-10-CM | POA: Diagnosis not present

## 2020-07-27 DIAGNOSIS — R739 Hyperglycemia, unspecified: Secondary | ICD-10-CM | POA: Diagnosis not present

## 2020-07-27 DIAGNOSIS — G43809 Other migraine, not intractable, without status migrainosus: Secondary | ICD-10-CM | POA: Diagnosis not present

## 2020-07-27 DIAGNOSIS — F419 Anxiety disorder, unspecified: Secondary | ICD-10-CM | POA: Diagnosis not present

## 2020-07-27 DIAGNOSIS — I10 Essential (primary) hypertension: Secondary | ICD-10-CM | POA: Diagnosis not present

## 2020-08-21 DIAGNOSIS — M5412 Radiculopathy, cervical region: Secondary | ICD-10-CM | POA: Diagnosis not present

## 2020-09-21 DIAGNOSIS — M25512 Pain in left shoulder: Secondary | ICD-10-CM | POA: Diagnosis not present

## 2020-10-11 DIAGNOSIS — M67912 Unspecified disorder of synovium and tendon, left shoulder: Secondary | ICD-10-CM | POA: Diagnosis not present

## 2020-10-13 ENCOUNTER — Other Ambulatory Visit: Payer: Self-pay | Admitting: Orthopedic Surgery

## 2020-10-13 DIAGNOSIS — M67912 Unspecified disorder of synovium and tendon, left shoulder: Secondary | ICD-10-CM

## 2020-11-01 ENCOUNTER — Other Ambulatory Visit: Payer: Self-pay

## 2020-11-01 ENCOUNTER — Ambulatory Visit
Admission: RE | Admit: 2020-11-01 | Discharge: 2020-11-01 | Disposition: A | Discharge status: Home / Self Care | Payer: Federal, State, Local not specified - PPO | Source: Ambulatory Visit | Attending: Orthopedic Surgery | Admitting: Orthopedic Surgery

## 2020-11-01 DIAGNOSIS — M67912 Unspecified disorder of synovium and tendon, left shoulder: Secondary | ICD-10-CM

## 2020-11-13 DIAGNOSIS — R202 Paresthesia of skin: Secondary | ICD-10-CM | POA: Diagnosis not present

## 2020-11-13 DIAGNOSIS — R531 Weakness: Secondary | ICD-10-CM | POA: Diagnosis not present

## 2020-11-13 DIAGNOSIS — M67912 Unspecified disorder of synovium and tendon, left shoulder: Secondary | ICD-10-CM | POA: Diagnosis not present

## 2020-11-13 DIAGNOSIS — R200 Anesthesia of skin: Secondary | ICD-10-CM | POA: Diagnosis not present

## 2020-11-13 DIAGNOSIS — M25512 Pain in left shoulder: Secondary | ICD-10-CM | POA: Diagnosis not present

## 2020-11-15 DIAGNOSIS — M67912 Unspecified disorder of synovium and tendon, left shoulder: Secondary | ICD-10-CM | POA: Diagnosis not present

## 2020-11-15 DIAGNOSIS — M7542 Impingement syndrome of left shoulder: Secondary | ICD-10-CM | POA: Diagnosis not present

## 2020-11-19 DIAGNOSIS — M545 Low back pain, unspecified: Secondary | ICD-10-CM | POA: Diagnosis not present

## 2020-11-21 DIAGNOSIS — M7542 Impingement syndrome of left shoulder: Secondary | ICD-10-CM | POA: Diagnosis not present

## 2020-11-21 DIAGNOSIS — M67912 Unspecified disorder of synovium and tendon, left shoulder: Secondary | ICD-10-CM | POA: Diagnosis not present

## 2020-11-23 DIAGNOSIS — M7542 Impingement syndrome of left shoulder: Secondary | ICD-10-CM | POA: Diagnosis not present

## 2020-11-23 DIAGNOSIS — M5416 Radiculopathy, lumbar region: Secondary | ICD-10-CM | POA: Diagnosis not present

## 2020-11-23 DIAGNOSIS — M67912 Unspecified disorder of synovium and tendon, left shoulder: Secondary | ICD-10-CM | POA: Diagnosis not present

## 2020-11-24 ENCOUNTER — Other Ambulatory Visit: Payer: Self-pay | Admitting: Orthopedic Surgery

## 2020-11-24 DIAGNOSIS — M5416 Radiculopathy, lumbar region: Secondary | ICD-10-CM

## 2020-12-06 DIAGNOSIS — M5416 Radiculopathy, lumbar region: Secondary | ICD-10-CM | POA: Diagnosis not present

## 2020-12-09 ENCOUNTER — Ambulatory Visit
Admission: RE | Admit: 2020-12-09 | Discharge: 2020-12-09 | Disposition: A | Discharge status: Home / Self Care | Payer: Federal, State, Local not specified - PPO | Source: Ambulatory Visit | Attending: Orthopedic Surgery | Admitting: Orthopedic Surgery

## 2020-12-09 ENCOUNTER — Other Ambulatory Visit: Payer: Self-pay

## 2020-12-09 DIAGNOSIS — M5416 Radiculopathy, lumbar region: Secondary | ICD-10-CM

## 2020-12-09 DIAGNOSIS — M48061 Spinal stenosis, lumbar region without neurogenic claudication: Secondary | ICD-10-CM | POA: Diagnosis not present

## 2020-12-09 DIAGNOSIS — M545 Low back pain, unspecified: Secondary | ICD-10-CM | POA: Diagnosis not present

## 2020-12-15 DIAGNOSIS — M5416 Radiculopathy, lumbar region: Secondary | ICD-10-CM | POA: Diagnosis not present

## 2020-12-21 DIAGNOSIS — M5416 Radiculopathy, lumbar region: Secondary | ICD-10-CM | POA: Diagnosis not present

## 2020-12-29 ENCOUNTER — Other Ambulatory Visit: Payer: Self-pay | Admitting: Orthopedic Surgery

## 2020-12-29 DIAGNOSIS — M5416 Radiculopathy, lumbar region: Secondary | ICD-10-CM | POA: Diagnosis not present

## 2021-01-01 NOTE — Progress Notes (Signed)
Surgical Instructions    Your procedure is scheduled on 01/03/21.  Report to [facility] Main Entrance "A" at 12:00PM, then check in with the Admitting office.  Call this number if you have problems the morning of surgery:  [phone]   If you have any questions prior to your surgery date call [phone]: Open Monday-Friday 8am-4pm    Remember:  Do not eat or drink after midnight the night before your surgery    Take these medicines the morning of surgery with A SIP OF WATER  ALPRAZolam Duanne Moron)  If needed cetirizine (ZYRTEC)  If needed nadolol (CORGARD)  RELPAX if needed sertraline (ZOLOFT)    As of today, STOP taking any Aspirin (unless otherwise instructed by your surgeon) Aleve, Naproxen, Ibuprofen, Motrin, Advil, Goody's, BC's, all herbal medications, fish oil, and all vitamins.                     Do not wear jewelry, make up, or nail polish            Do not wear lotions, powders, perfumes/colognes, or deodorant.            Do not shave 48 hours prior to surgery.             Do not bring valuables to the hospital.            [facility] is not responsible for any belongings or valuables.  Do NOT Smoke (Tobacco/Vaping) or drink Alcohol 24 hours prior to your procedure If you use a CPAP at night, you may bring all equipment for your overnight stay.   Contacts, glasses, dentures or bridgework may not be worn into surgery, please bring cases for these belongings   For patients admitted to the hospital, discharge time will be determined by your treatment team.   Patients discharged the day of surgery will not be allowed to drive home, and someone needs to stay with them for 24 hours.    Special instructions:   [place]- Preparing For Surgery  Before surgery, you can play an important role. Because skin is not sterile, your skin needs to be as free of germs as possible. You can reduce the number of germs on your skin by washing with CHG (chlorahexidine gluconate)  Soap before surgery.  CHG is an antiseptic cleaner which kills germs and bonds with the skin to continue killing germs even after washing.    Oral Hygiene is also important to reduce your risk of infection.  Remember - BRUSH YOUR TEETH THE MORNING OF SURGERY WITH YOUR REGULAR TOOTHPASTE  Please do not use if you have an allergy to CHG or antibacterial soaps. If your skin becomes reddened/irritated stop using the CHG.  Do not shave (including legs and underarms) for at least 48 hours prior to first CHG shower. It is OK to shave your face.  Please follow these instructions carefully.   1. Shower the NIGHT BEFORE SURGERY and the MORNING OF SURGERY  2. If you chose to wash your hair, wash your hair first as usual with your normal shampoo.  3. After you shampoo, rinse your hair and body thoroughly to remove the shampoo.  4. Wash Face and genitals (private parts) with your normal soap.   5.  Shower the NIGHT BEFORE SURGERY and the MORNING OF SURGERY with CHG Soap.   6. Use CHG Soap as you would any other liquid soap. You can apply CHG directly to the skin and wash gently  with a scrungie or a clean washcloth.   7. Apply the CHG Soap to your body ONLY FROM THE NECK DOWN.  Do not use on open wounds or open sores. Avoid contact with your eyes, ears, mouth and genitals (private parts). Wash Face and genitals (private parts)  with your normal soap.   8. Wash thoroughly, paying special attention to the area where your surgery will be performed.  9. Thoroughly rinse your body with warm water from the neck down.  10. DO NOT shower/wash with your normal soap after using and rinsing off the CHG Soap.  11. Pat yourself dry with a CLEAN TOWEL.  12. Wear CLEAN PAJAMAS to bed the night before surgery  13. Place CLEAN SHEETS on your bed the night before your surgery  14. DO NOT SLEEP WITH PETS.   Day of Surgery: Take a shower with CHG soap.  Wear Clean/Comfortable clothing the morning of  surgery Do not apply any deodorants/lotions.   Remember to brush your teeth WITH YOUR REGULAR TOOTHPASTE.   Please read over the following fact sheets that you were given.

## 2021-01-02 ENCOUNTER — Encounter (HOSPITAL_COMMUNITY)
Admission: RE | Admit: 2021-01-02 | Discharge: 2021-01-02 | Disposition: A | Discharge status: Home / Self Care | Payer: Federal, State, Local not specified - PPO | Source: Ambulatory Visit | Attending: Orthopedic Surgery | Admitting: Orthopedic Surgery

## 2021-01-02 ENCOUNTER — Other Ambulatory Visit: Payer: Self-pay

## 2021-01-02 ENCOUNTER — Encounter (HOSPITAL_COMMUNITY): Payer: Self-pay

## 2021-01-02 DIAGNOSIS — Z20822 Contact with and (suspected) exposure to covid-19: Secondary | ICD-10-CM | POA: Insufficient documentation

## 2021-01-02 DIAGNOSIS — M5416 Radiculopathy, lumbar region: Secondary | ICD-10-CM | POA: Diagnosis not present

## 2021-01-02 DIAGNOSIS — Z01818 Encounter for other preprocedural examination: Secondary | ICD-10-CM | POA: Diagnosis not present

## 2021-01-02 HISTORY — DX: Other specified postprocedural states: R11.2

## 2021-01-02 HISTORY — DX: Other specified postprocedural states: Z98.890

## 2021-01-02 HISTORY — DX: Personal history of urinary calculi: Z87.442

## 2021-01-02 LAB — BASIC METABOLIC PANEL
Anion gap: 8 (ref 5–15)
BUN: 21 mg/dL — ABNORMAL HIGH (ref 6–20)
CO2: 28 mmol/L (ref 22–32)
Calcium: 9.1 mg/dL (ref 8.9–10.3)
Chloride: 103 mmol/L (ref 98–111)
Creatinine, Ser: 1.13 mg/dL (ref 0.61–1.24)
GFR, Estimated: >60 mL/min (ref >60)
Glucose, Bld: 140 mg/dL — ABNORMAL HIGH (ref 70–99)
Potassium: 3.2 mmol/L — ABNORMAL LOW (ref 3.5–5.1)
Sodium: 139 mmol/L (ref 135–145)

## 2021-01-02 LAB — CBC
HCT: 42.4 % (ref 39.0–52.0)
Hemoglobin: 14.3 g/dL (ref 13.0–17.0)
MCH: 29.5 pg (ref 26.0–34.0)
MCHC: 33.7 g/dL (ref 30.0–36.0)
MCV: 87.6 fL (ref 80.0–100.0)
Platelets: 294 10*3/uL (ref 150–400)
RBC: 4.84 MIL/uL (ref 4.22–5.81)
RDW: 12.6 % (ref 11.5–15.5)
WBC: 8 10*3/uL (ref 4.0–10.5)
nRBC: 0 % (ref 0.0–0.2)

## 2021-01-02 LAB — SARS CORONAVIRUS 2 (TAT 6-24 HRS): SARS Coronavirus 2: NEGATIVE

## 2021-01-02 NOTE — Progress Notes (Signed)
PCP: Dr. Kenton Kingfisher Cardiologist: Denies current MD, Saw Dr. Claiborne Billings in 2014 for workup due to family history  EKG: Today CXR: n/a ECHO: denies Stress Test: 2014 Cardiac Cath: denies  Covid tested today, aware to quarantine.  Patient denies shortness of breath, fever, cough, and chest pain at PAT appointment.  Patient verbalized understanding of instructions provided today at the PAT appointment.  Patient asked to review instructions at home and day of surgery.

## 2021-01-02 NOTE — Progress Notes (Signed)
Surgical Instructions    Your procedure is scheduled on 01/03/21.  Report to [facility] Main Entrance "A" at 12:00PM, then check in with the Admitting office.  Call this number if you have problems the morning of surgery:  [phone]   If you have any questions prior to your surgery date call [phone]: Open Monday-Friday 8am-4pm   Remember:  Do not eat or drink after midnight the night before your surgery    Take these medicines the morning of surgery with A SIP OF WATER:  nadolol (CORGARD)  sertraline (ZOLOFT)   ALPRAZolam (XANAX)  If needed cetirizine (ZYRTEC)  If needed RELPAX if needed   As of today, STOP taking any Aspirin (unless otherwise instructed by your surgeon) Aleve, Naproxen, Ibuprofen, Motrin, Advil, Goody's, BC's, all herbal medications, fish oil, and all vitamins.                     Do not wear jewelry.            Do not wear lotions, powders, perfumes/colognes, or deodorant.            Do not shave 48 hours prior to surgery.             Do not bring valuables to the hospital.            [facility] is not responsible for any belongings or valuables.  Do NOT Smoke (Tobacco/Vaping) or drink Alcohol 24 hours prior to your procedure If you use a CPAP at night, you may bring all equipment for your overnight stay.   Contacts, glasses, dentures or bridgework may not be worn into surgery, please bring cases for these belongings   For patients admitted to the hospital, discharge time will be determined by your treatment team.   Patients discharged the day of surgery will not be allowed to drive home, and someone needs to stay with them for 24 hours.    Special instructions:   [place]- Preparing For Surgery  Before surgery, you can play an important role. Because skin is not sterile, your skin needs to be as free of germs as possible. You can reduce the number of germs on your skin by washing with CHG (chlorahexidine gluconate) Soap before surgery.  CHG  is an antiseptic cleaner which kills germs and bonds with the skin to continue killing germs even after washing.    Oral Hygiene is also important to reduce your risk of infection.  Remember - BRUSH YOUR TEETH THE MORNING OF SURGERY WITH YOUR REGULAR TOOTHPASTE  Please do not use if you have an allergy to CHG or antibacterial soaps. If your skin becomes reddened/irritated stop using the CHG.  Do not shave (including legs and underarms) for at least 48 hours prior to first CHG shower. It is OK to shave your face.  Please follow these instructions carefully.   1. Shower the NIGHT BEFORE SURGERY and the MORNING OF SURGERY  2. If you chose to wash your hair, wash your hair first as usual with your normal shampoo.  3. After you shampoo, rinse your hair and body thoroughly to remove the shampoo.  4. Wash Face and genitals (private parts) with your normal soap.   5.  Shower the NIGHT BEFORE SURGERY and the MORNING OF SURGERY with CHG Soap.   6. Use CHG Soap as you would any other liquid soap. You can apply CHG directly to the skin and wash gently with a scrungie or a  clean washcloth.   7. Apply the CHG Soap to your body ONLY FROM THE NECK DOWN.  Do not use on open wounds or open sores. Avoid contact with your eyes, ears, mouth and genitals (private parts). Wash Face and genitals (private parts)  with your normal soap.   8. Wash thoroughly, paying special attention to the area where your surgery will be performed.  9. Thoroughly rinse your body with warm water from the neck down.  10. DO NOT shower/wash with your normal soap after using and rinsing off the CHG Soap.  11. Pat yourself dry with a CLEAN TOWEL.  12. Wear CLEAN PAJAMAS to bed the night before surgery  13. Place CLEAN SHEETS on your bed the night before your surgery  14. DO NOT SLEEP WITH PETS.   Day of Surgery: Take a shower with CHG soap.  Wear Clean/Comfortable clothing the morning of surgery Do not apply any  deodorants/lotions.   Remember to brush your teeth WITH YOUR REGULAR TOOTHPASTE.   Please read over the following fact sheets that you were given.

## 2021-01-03 ENCOUNTER — Ambulatory Visit (HOSPITAL_COMMUNITY): Payer: Federal, State, Local not specified - PPO

## 2021-01-03 ENCOUNTER — Ambulatory Visit (HOSPITAL_COMMUNITY)
Admission: RE | Disposition: A | Discharge status: Home / Self Care | Payer: Self-pay | Source: Home / Self Care | Attending: Orthopedic Surgery

## 2021-01-03 ENCOUNTER — Encounter (HOSPITAL_COMMUNITY): Payer: Self-pay | Admitting: Orthopedic Surgery

## 2021-01-03 ENCOUNTER — Ambulatory Visit (HOSPITAL_COMMUNITY)
Admission: RE | Admit: 2021-01-03 | Discharge: 2021-01-03 | Disposition: A | Discharge status: Home / Self Care | Payer: Federal, State, Local not specified - PPO | Attending: Orthopedic Surgery | Admitting: Orthopedic Surgery

## 2021-01-03 ENCOUNTER — Other Ambulatory Visit: Payer: Self-pay

## 2021-01-03 ENCOUNTER — Ambulatory Visit (HOSPITAL_COMMUNITY): Payer: Federal, State, Local not specified - PPO | Admitting: Physician Assistant

## 2021-01-03 DIAGNOSIS — M5126 Other intervertebral disc displacement, lumbar region: Secondary | ICD-10-CM | POA: Diagnosis not present

## 2021-01-03 DIAGNOSIS — M5116 Intervertebral disc disorders with radiculopathy, lumbar region: Secondary | ICD-10-CM | POA: Diagnosis not present

## 2021-01-03 DIAGNOSIS — Z79899 Other long term (current) drug therapy: Secondary | ICD-10-CM | POA: Diagnosis not present

## 2021-01-03 DIAGNOSIS — K429 Umbilical hernia without obstruction or gangrene: Secondary | ICD-10-CM | POA: Diagnosis not present

## 2021-01-03 DIAGNOSIS — M5416 Radiculopathy, lumbar region: Secondary | ICD-10-CM | POA: Diagnosis not present

## 2021-01-03 DIAGNOSIS — F419 Anxiety disorder, unspecified: Secondary | ICD-10-CM | POA: Diagnosis not present

## 2021-01-03 DIAGNOSIS — Z981 Arthrodesis status: Secondary | ICD-10-CM | POA: Diagnosis not present

## 2021-01-03 DIAGNOSIS — Z419 Encounter for procedure for purposes other than remedying health state, unspecified: Secondary | ICD-10-CM

## 2021-01-03 DIAGNOSIS — I10 Essential (primary) hypertension: Secondary | ICD-10-CM | POA: Diagnosis not present

## 2021-01-03 HISTORY — PX: LUMBAR LAMINECTOMY/DECOMPRESSION MICRODISCECTOMY: SHX5026

## 2021-01-03 LAB — PROTIME-INR
INR: 1 (ref 0.8–1.2)
Prothrombin Time: 12.9 seconds (ref 11.4–15.2)

## 2021-01-03 LAB — CBC WITH DIFFERENTIAL/PLATELET
Abs Immature Granulocytes: 0.05 10*3/uL (ref 0.00–0.07)
Basophils Absolute: 0 10*3/uL (ref 0.0–0.1)
Basophils Relative: 1 %
Eosinophils Absolute: 0.2 10*3/uL (ref 0.0–0.5)
Eosinophils Relative: 3 %
HCT: 43.2 % (ref 39.0–52.0)
Hemoglobin: 14.3 g/dL (ref 13.0–17.0)
Immature Granulocytes: 1 %
Lymphocytes Relative: 22 %
Lymphs Abs: 1.7 10*3/uL (ref 0.7–4.0)
MCH: 29.6 pg (ref 26.0–34.0)
MCHC: 33.1 g/dL (ref 30.0–36.0)
MCV: 89.4 fL (ref 80.0–100.0)
Monocytes Absolute: 0.9 10*3/uL (ref 0.1–1.0)
Monocytes Relative: 12 %
Neutro Abs: 4.8 10*3/uL (ref 1.7–7.7)
Neutrophils Relative %: 61 %
Platelets: 306 10*3/uL (ref 150–400)
RBC: 4.83 MIL/uL (ref 4.22–5.81)
RDW: 12.7 % (ref 11.5–15.5)
WBC: 7.6 10*3/uL (ref 4.0–10.5)
nRBC: 0 % (ref 0.0–0.2)

## 2021-01-03 LAB — TYPE AND SCREEN
ABO/RH(D): O POS
Antibody Screen: NEGATIVE

## 2021-01-03 LAB — HEPATIC FUNCTION PANEL
ALT: 20 U/L (ref 0–44)
AST: 21 U/L (ref 15–41)
Albumin: 4 g/dL (ref 3.5–5.0)
Alkaline Phosphatase: 99 U/L (ref 38–126)
Bilirubin, Direct: <0.1 mg/dL (ref 0.0–0.2)
Total Bilirubin: 0.8 mg/dL (ref 0.3–1.2)
Total Protein: 6.6 g/dL (ref 6.5–8.1)

## 2021-01-03 LAB — APTT: aPTT: 30 seconds (ref 24–36)

## 2021-01-03 LAB — SURGICAL PCR SCREEN
MRSA, PCR: NEGATIVE
Staphylococcus aureus: NEGATIVE

## 2021-01-03 LAB — ABO/RH: ABO/RH(D): O POS

## 2021-01-03 SURGERY — LUMBAR LAMINECTOMY/DECOMPRESSION MICRODISCECTOMY
Anesthesia: General | Site: Spine Lumbar

## 2021-01-03 MED ORDER — ACETAMINOPHEN 10 MG/ML IV SOLN
INTRAVENOUS | Status: AC
  Filled 2021-01-03: qty 100

## 2021-01-03 MED ORDER — ACETAMINOPHEN 10 MG/ML IV SOLN
1000.0000 mg | Freq: Once | INTRAVENOUS | Status: DC | PRN
  Administered 2021-01-03: 1000 mg via INTRAVENOUS

## 2021-01-03 MED ORDER — APREPITANT 40 MG PO CAPS
40.0000 mg | ORAL_CAPSULE | Freq: Once | ORAL | Status: AC
  Administered 2021-01-03: 40 mg via ORAL
  Filled 2021-01-03: qty 1

## 2021-01-03 MED ORDER — ROCURONIUM BROMIDE 10 MG/ML (PF) SYRINGE
PREFILLED_SYRINGE | INTRAVENOUS | Status: DC | PRN
  Administered 2021-01-03: 30 mg via INTRAVENOUS
  Administered 2021-01-03: 70 mg via INTRAVENOUS

## 2021-01-03 MED ORDER — FENTANYL CITRATE (PF) 250 MCG/5ML IJ SOLN
INTRAMUSCULAR | Status: DC | PRN
  Administered 2021-01-03: 50 ug via INTRAVENOUS
  Administered 2021-01-03: 100 ug via INTRAVENOUS

## 2021-01-03 MED ORDER — OXYCODONE HCL 5 MG PO TABS
5.0000 mg | ORAL_TABLET | Freq: Once | ORAL | Status: AC | PRN
  Administered 2021-01-03: 5 mg via ORAL

## 2021-01-03 MED ORDER — METHYLENE BLUE 0.5 % INJ SOLN
INTRAVENOUS | Status: AC
  Filled 2021-01-03: qty 10

## 2021-01-03 MED ORDER — THROMBIN 20000 UNITS EX KIT
PACK | CUTANEOUS | Status: AC
  Filled 2021-01-03: qty 1

## 2021-01-03 MED ORDER — ORAL CARE MOUTH RINSE: 15.0000 mL | Freq: Once | OROMUCOSAL | Status: AC

## 2021-01-03 MED ORDER — METHYLPREDNISOLONE ACETATE 40 MG/ML IJ SUSP
INTRAMUSCULAR | Status: DC | PRN
  Administered 2021-01-03: 40 mg

## 2021-01-03 MED ORDER — FENTANYL CITRATE (PF) 250 MCG/5ML IJ SOLN
INTRAMUSCULAR | Status: AC
  Filled 2021-01-03: qty 5

## 2021-01-03 MED ORDER — METHYLENE BLUE 0.5 % INJ SOLN
INTRAVENOUS | Status: DC | PRN
  Administered 2021-01-03: 1 mL

## 2021-01-03 MED ORDER — 0.9 % SODIUM CHLORIDE (POUR BTL) OPTIME
TOPICAL | Status: DC | PRN
  Administered 2021-01-03: 1000 mL

## 2021-01-03 MED ORDER — HYDROCODONE-ACETAMINOPHEN 7.5-325 MG PO TABS: 1.0000 | ORAL_TABLET | Freq: Four times a day (QID) | ORAL | 0 refills | Status: DC | PRN

## 2021-01-03 MED ORDER — CEFAZOLIN SODIUM-DEXTROSE 2-4 GM/100ML-% IV SOLN
2.0000 g | INTRAVENOUS | Status: AC
  Administered 2021-01-03: 2 g via INTRAVENOUS

## 2021-01-03 MED ORDER — LIDOCAINE 2% (20 MG/ML) 5 ML SYRINGE
INTRAMUSCULAR | Status: AC
  Filled 2021-01-03: qty 5

## 2021-01-03 MED ORDER — OXYCODONE HCL 5 MG PO TABS
ORAL_TABLET | ORAL | Status: AC
  Filled 2021-01-03: qty 1

## 2021-01-03 MED ORDER — AMISULPRIDE (ANTIEMETIC) 5 MG/2ML IV SOLN: 10.0000 mg | Freq: Once | INTRAVENOUS | Status: DC | PRN

## 2021-01-03 MED ORDER — PROPOFOL 10 MG/ML IV BOLUS
INTRAVENOUS | Status: DC | PRN
  Administered 2021-01-03: 150 mg via INTRAVENOUS

## 2021-01-03 MED ORDER — ROCURONIUM BROMIDE 10 MG/ML (PF) SYRINGE
PREFILLED_SYRINGE | INTRAVENOUS | Status: AC
  Filled 2021-01-03: qty 10

## 2021-01-03 MED ORDER — CEFAZOLIN SODIUM-DEXTROSE 2-4 GM/100ML-% IV SOLN
INTRAVENOUS | Status: AC
  Filled 2021-01-03: qty 100

## 2021-01-03 MED ORDER — ONDANSETRON HCL 4 MG/2ML IJ SOLN
INTRAMUSCULAR | Status: AC
  Filled 2021-01-03: qty 2

## 2021-01-03 MED ORDER — LACTATED RINGERS IV SOLN: INTRAVENOUS | Status: DC

## 2021-01-03 MED ORDER — BUPIVACAINE-EPINEPHRINE 0.25% -1:200000 IJ SOLN
INTRAMUSCULAR | Status: DC | PRN
  Administered 2021-01-03: 5 mL
  Administered 2021-01-03: 20 mL

## 2021-01-03 MED ORDER — EPHEDRINE SULFATE-NACL 50-0.9 MG/10ML-% IV SOSY
PREFILLED_SYRINGE | INTRAVENOUS | Status: DC | PRN
  Administered 2021-01-03: 10 mg via INTRAVENOUS
  Administered 2021-01-03: 5 mg via INTRAVENOUS
  Administered 2021-01-03: 10 mg via INTRAVENOUS

## 2021-01-03 MED ORDER — POVIDONE-IODINE 7.5 % EX SOLN: Freq: Once | CUTANEOUS | Status: DC

## 2021-01-03 MED ORDER — CHLORHEXIDINE GLUCONATE 0.12 % MT SOLN
OROMUCOSAL | Status: AC
  Administered 2021-01-03: 15 mL via OROMUCOSAL
  Filled 2021-01-03: qty 15

## 2021-01-03 MED ORDER — SUGAMMADEX SODIUM 200 MG/2ML IV SOLN
INTRAVENOUS | Status: DC | PRN
  Administered 2021-01-03: 200 mg via INTRAVENOUS

## 2021-01-03 MED ORDER — THROMBIN 20000 UNITS EX SOLR
CUTANEOUS | Status: DC | PRN
  Administered 2021-01-03: 20 mL via TOPICAL

## 2021-01-03 MED ORDER — METHYLPREDNISOLONE ACETATE 40 MG/ML IJ SUSP
INTRAMUSCULAR | Status: AC
  Filled 2021-01-03: qty 1

## 2021-01-03 MED ORDER — MIDAZOLAM HCL 2 MG/2ML IJ SOLN
INTRAMUSCULAR | Status: AC
  Filled 2021-01-03: qty 2

## 2021-01-03 MED ORDER — FENTANYL CITRATE (PF) 100 MCG/2ML IJ SOLN
INTRAMUSCULAR | Status: AC
  Filled 2021-01-03: qty 2

## 2021-01-03 MED ORDER — BUPIVACAINE LIPOSOME 1.3 % IJ SUSP
INTRAMUSCULAR | Status: DC | PRN
  Administered 2021-01-03: 20 mL

## 2021-01-03 MED ORDER — BUPIVACAINE LIPOSOME 1.3 % IJ SUSP
INTRAMUSCULAR | Status: AC
  Filled 2021-01-03: qty 20

## 2021-01-03 MED ORDER — HEMOSTATIC AGENTS (NO CHARGE) OPTIME
TOPICAL | Status: DC | PRN
  Administered 2021-01-03: 1 via TOPICAL

## 2021-01-03 MED ORDER — ONDANSETRON HCL 4 MG/2ML IJ SOLN
INTRAMUSCULAR | Status: DC | PRN
  Administered 2021-01-03: 4 mg via INTRAVENOUS

## 2021-01-03 MED ORDER — DEXAMETHASONE SODIUM PHOSPHATE 10 MG/ML IJ SOLN
INTRAMUSCULAR | Status: DC | PRN
  Administered 2021-01-03: 10 mg via INTRAVENOUS

## 2021-01-03 MED ORDER — MIDAZOLAM HCL 2 MG/2ML IJ SOLN
INTRAMUSCULAR | Status: DC | PRN
  Administered 2021-01-03: 2 mg via INTRAVENOUS

## 2021-01-03 MED ORDER — SCOPOLAMINE 1 MG/3DAYS TD PT72
MEDICATED_PATCH | TRANSDERMAL | Status: AC
  Filled 2021-01-03: qty 1

## 2021-01-03 MED ORDER — OXYCODONE HCL 5 MG/5ML PO SOLN
5.0000 mg | Freq: Once | ORAL | Status: AC | PRN
Start: 2021-01-03 — End: 2021-01-03

## 2021-01-03 MED ORDER — METHOCARBAMOL 500 MG PO TABS: 500.0000 mg | ORAL_TABLET | Freq: Four times a day (QID) | ORAL | 0 refills | Status: AC | PRN

## 2021-01-03 MED ORDER — PROMETHAZINE HCL 25 MG/ML IJ SOLN: 6.2500 mg | INTRAMUSCULAR | Status: DC | PRN

## 2021-01-03 MED ORDER — PROPOFOL 10 MG/ML IV BOLUS
INTRAVENOUS | Status: AC
  Filled 2021-01-03: qty 20

## 2021-01-03 MED ORDER — CHLORHEXIDINE GLUCONATE 0.12 % MT SOLN: 15.0000 mL | Freq: Once | OROMUCOSAL | Status: AC

## 2021-01-03 MED ORDER — SCOPOLAMINE 1 MG/3DAYS TD PT72
1.0000 | MEDICATED_PATCH | TRANSDERMAL | Status: DC
  Administered 2021-01-03: 1 via TRANSDERMAL

## 2021-01-03 MED ORDER — BUPIVACAINE-EPINEPHRINE (PF) 0.25% -1:200000 IJ SOLN
INTRAMUSCULAR | Status: AC
  Filled 2021-01-03: qty 30

## 2021-01-03 MED ORDER — FENTANYL CITRATE (PF) 100 MCG/2ML IJ SOLN
25.0000 ug | INTRAMUSCULAR | Status: DC | PRN
  Administered 2021-01-03: 50 ug via INTRAVENOUS

## 2021-01-03 MED ORDER — LIDOCAINE 2% (20 MG/ML) 5 ML SYRINGE
INTRAMUSCULAR | Status: DC | PRN
  Administered 2021-01-03: 100 mg via INTRAVENOUS

## 2021-01-03 SURGICAL SUPPLY — 69 items
BENZOIN TINCTURE PRP APPL 2/3 (GAUZE/BANDAGES/DRESSINGS) ×4 IMPLANT
BNDG GAUZE ELAST 4 BULKY (GAUZE/BANDAGES/DRESSINGS) IMPLANT
BUR RND DIAMOND ELITE 3.5 (BURR) ×2 IMPLANT
BUR ROUND PRECISION 4.0 (BURR) ×2 IMPLANT
CABLE BIPOLOR RESECTION CORD (MISCELLANEOUS) ×2 IMPLANT
CANISTER SUCT 3000ML PPV (MISCELLANEOUS) ×2 IMPLANT
CARTRIDGE OIL MAESTRO DRILL (MISCELLANEOUS) ×1 IMPLANT
COVER SURGICAL LIGHT HANDLE (MISCELLANEOUS) ×2 IMPLANT
DIFFUSER DRILL AIR PNEUMATIC (MISCELLANEOUS) ×2 IMPLANT
DRAIN CHANNEL 15F RND FF W/TCR (WOUND CARE) IMPLANT
DRAPE C-ARM 42X72 X-RAY (DRAPES) ×2 IMPLANT
DRAPE C-ARMOR (DRAPES) ×2 IMPLANT
DRAPE POUCH INSTRU U-SHP 10X18 (DRAPES) ×2 IMPLANT
DRAPE SURG 17X23 STRL (DRAPES) ×8 IMPLANT
DURAPREP 26ML APPLICATOR (WOUND CARE) ×2 IMPLANT
ELECT BLADE 4.0 EZ CLEAN MEGAD (MISCELLANEOUS) ×2
ELECT CAUTERY BLADE 6.4 (BLADE) ×2 IMPLANT
ELECT REM PT RETURN 9FT ADLT (ELECTROSURGICAL) ×2
ELECTRODE BLDE 4.0 EZ CLN MEGD (MISCELLANEOUS) ×1 IMPLANT
ELECTRODE REM PT RTRN 9FT ADLT (ELECTROSURGICAL) ×1 IMPLANT
EVACUATOR SILICONE 100CC (DRAIN) IMPLANT
FILTER STRAW FLUID ASPIR (MISCELLANEOUS) ×2 IMPLANT
GAUZE 4X4 16PLY RFD (DISPOSABLE) ×2 IMPLANT
GAUZE SPONGE 4X4 12PLY STRL (GAUZE/BANDAGES/DRESSINGS) ×2 IMPLANT
GLOVE BIO SURGEON STRL SZ7 (GLOVE) ×2 IMPLANT
GLOVE BIO SURGEON STRL SZ8 (GLOVE) ×2 IMPLANT
GLOVE SRG 8 PF TXTR STRL LF DI (GLOVE) ×1 IMPLANT
GLOVE SURG UNDER POLY LF SZ7 (GLOVE) ×2 IMPLANT
GLOVE SURG UNDER POLY LF SZ8 (GLOVE) ×2
GOWN STRL REUS W/ TWL LRG LVL3 (GOWN DISPOSABLE) ×1 IMPLANT
GOWN STRL REUS W/ TWL XL LVL3 (GOWN DISPOSABLE) ×2 IMPLANT
GOWN STRL REUS W/TWL LRG LVL3 (GOWN DISPOSABLE) ×2
GOWN STRL REUS W/TWL XL LVL3 (GOWN DISPOSABLE) ×4
IV CATH 14GX2 1/4 (CATHETERS) ×2 IMPLANT
KIT BASIN OR (CUSTOM PROCEDURE TRAY) ×2 IMPLANT
KIT POSITION SURG JACKSON T1 (MISCELLANEOUS) ×2 IMPLANT
KIT TURNOVER KIT B (KITS) ×2 IMPLANT
MARKER SKIN DUAL TIP RULER LAB (MISCELLANEOUS) ×2 IMPLANT
NEEDLE 18GX1X1/2 (RX/OR ONLY) (NEEDLE) ×2 IMPLANT
NEEDLE 22X1 1/2 (OR ONLY) (NEEDLE) ×2 IMPLANT
NEEDLE HYPO 25GX1X1/2 BEV (NEEDLE) ×2 IMPLANT
NEEDLE SPNL 18GX3.5 QUINCKE PK (NEEDLE) ×4 IMPLANT
NS IRRIG 1000ML POUR BTL (IV SOLUTION) ×2 IMPLANT
OIL CARTRIDGE MAESTRO DRILL (MISCELLANEOUS) ×2
PACK LAMINECTOMY ORTHO (CUSTOM PROCEDURE TRAY) ×2 IMPLANT
PACK UNIVERSAL I (CUSTOM PROCEDURE TRAY) ×2 IMPLANT
PAD ARMBOARD 7.5X6 YLW CONV (MISCELLANEOUS) ×4 IMPLANT
PATTIES SURGICAL .5 X.5 (GAUZE/BANDAGES/DRESSINGS) ×2 IMPLANT
PATTIES SURGICAL .5 X1 (DISPOSABLE) ×2 IMPLANT
SPONGE INTESTINAL PEANUT (DISPOSABLE) ×2 IMPLANT
SPONGE SURGIFOAM ABS GEL SZ50 (HEMOSTASIS) ×2 IMPLANT
STRIP CLOSURE SKIN 1/2X4 (GAUZE/BANDAGES/DRESSINGS) ×4 IMPLANT
SURGIFLO W/THROMBIN 8M KIT (HEMOSTASIS) ×2 IMPLANT
SUT MNCRL AB 4-0 PS2 18 (SUTURE) ×2 IMPLANT
SUT VIC AB 0 CT1 18XCR BRD 8 (SUTURE) ×1 IMPLANT
SUT VIC AB 0 CT1 8-18 (SUTURE) ×2
SUT VIC AB 1 CT1 18XCR BRD 8 (SUTURE) ×1 IMPLANT
SUT VIC AB 1 CT1 8-18 (SUTURE) ×2
SUT VIC AB 2-0 CT2 18 VCP726D (SUTURE) ×2 IMPLANT
SYR 20ML LL LF (SYRINGE) ×2 IMPLANT
SYR BULB IRRIG 60ML STRL (SYRINGE) ×2 IMPLANT
SYR CONTROL 10ML LL (SYRINGE) ×4 IMPLANT
SYR TB 1ML 25GX5/8 (SYRINGE) ×4 IMPLANT
SYR TB 1ML LUER SLIP (SYRINGE) ×4 IMPLANT
TAPE CLOTH SURG 6X10 WHT LF (GAUZE/BANDAGES/DRESSINGS) ×2 IMPLANT
TOWEL GREEN STERILE (TOWEL DISPOSABLE) ×2 IMPLANT
TOWEL GREEN STERILE FF (TOWEL DISPOSABLE) ×2 IMPLANT
WATER STERILE IRR 1000ML POUR (IV SOLUTION) IMPLANT
YANKAUER SUCT BULB TIP NO VENT (SUCTIONS) ×2 IMPLANT

## 2021-01-03 NOTE — Transfer of Care (Signed)
Immediate Anesthesia Transfer of Care Note  Patient: Chad Cole  Procedure(s) Performed: LUMBAR 1- LUMBAR 2 DECOMPRESSION (N/A Spine Lumbar)  Patient Location: PACU  Anesthesia Type:General  Level of Consciousness: responds to stimulation  Airway & Oxygen Therapy: Patient Spontanous Breathing and Patient connected to face mask oxygen  Post-op Assessment: Report given to RN and Post -op Vital signs reviewed and stable  Post vital signs: Reviewed and stable  Last Vitals:  Vitals Value Taken Time  BP 146/74 01/03/21 1934  Temp    Pulse 76 01/03/21 1935  Resp 26 01/03/21 1935  SpO2 100 % 01/03/21 1935  Vitals shown include unvalidated device data.  Last Pain:  Vitals:   01/03/21 1135  TempSrc: Oral         Complications: No complications documented.

## 2021-01-03 NOTE — Anesthesia Preprocedure Evaluation (Signed)
Anesthesia Evaluation  Patient identified by MRN, date of birth, ID band Patient awake    Reviewed: Allergy & Precautions, NPO status , Patient's Chart, lab work & pertinent test results  History of Anesthesia Complications (+) PONV  Airway Mallampati: II  TM Distance: >3 FB Neck ROM: Full    Dental  (+) Teeth Intact   Pulmonary    Pulmonary exam normal        Cardiovascular hypertension, Pt. on medications  Rhythm:Regular Rate:Normal     Neuro/Psych  Headaches, Anxiety    GI/Hepatic negative GI ROS, Neg liver ROS,   Endo/Other  negative endocrine ROS  Renal/GU negative Renal ROS  negative genitourinary   Musculoskeletal Lumbar stenosis   Abdominal (+)  Abdomen: soft. Bowel sounds: normal.  Peds  Hematology negative hematology ROS (+)   Anesthesia Other Findings   Reproductive/Obstetrics                             Anesthesia Physical Anesthesia Plan  ASA: II  Anesthesia Plan: General   Post-op Pain Management:    Induction: Intravenous  PONV Risk Score and Plan: 3 and Ondansetron, Aprepitant, Dexamethasone, Midazolam and Treatment may vary due to age or medical condition  Airway Management Planned: Mask and Oral ETT  Additional Equipment: None  Intra-op Plan:   Post-operative Plan: Extubation in OR  Informed Consent: I have reviewed the patients History and Physical, chart, labs and discussed the procedure including the risks, benefits and alternatives for the proposed anesthesia with the patient or authorized representative who has indicated his/her understanding and acceptance.     Dental advisory given  Plan Discussed with: CRNA  Anesthesia Plan Comments: (Lab Results      Component                Value               Date                      WBC                      7.6                 01/02/2021                HGB                      14.3                 01/02/2021                HCT                      43.2                01/02/2021                MCV                      89.4                01/02/2021                PLT                      306  01/02/2021           Lab Results      Component                Value               Date                      NA                       139                 01/02/2021                K                        3.2 (L)             01/02/2021                CO2                      28                  01/02/2021                GLUCOSE                  140 (H)             01/02/2021                BUN                      21 (H)              01/02/2021                CREATININE               1.13                01/02/2021                CALCIUM                  9.1                 01/02/2021                GFRNONAA                 >60                 01/02/2021                GFRAA                    >60                 01/27/2020          )        Anesthesia Quick Evaluation

## 2021-01-03 NOTE — Anesthesia Procedure Notes (Signed)
Procedure Name: Intubation Date/Time: 01/03/2021 4:34 PM Performed by: Georgia Duff, CRNA Pre-anesthesia Checklist: Patient identified, Emergency Drugs available, Suction available and Patient being monitored Patient Re-evaluated:Patient Re-evaluated prior to induction Oxygen Delivery Method: Circle System Utilized Preoxygenation: Pre-oxygenation with 100% oxygen Induction Type: IV induction Ventilation: Mask ventilation without difficulty Laryngoscope Size: Miller and 3 Grade View: Grade I Tube type: Oral Tube size: 8.0 mm Number of attempts: 1 Airway Equipment and Method: Stylet and Oral airway Placement Confirmation: ETT inserted through vocal cords under direct vision,  positive ETCO2 and breath sounds checked- equal and bilateral Secured at: 23 cm Tube secured with: Tape Dental Injury: Teeth and Oropharynx as per pre-operative assessment

## 2021-01-03 NOTE — Progress Notes (Signed)
Spoke with main lab - they will add on CBC with diff to pt CBC sample and HFP order placed to add onto BMP from PAT to have CMP. Will draw other ordered labs for pt.

## 2021-01-03 NOTE — Anesthesia Postprocedure Evaluation (Signed)
Anesthesia Post Note  Patient: Chad Cole  Procedure(s) Performed: LUMBAR 1- LUMBAR 2 DECOMPRESSION (N/A Spine Lumbar)     Patient location during evaluation: PACU Anesthesia Type: General Level of consciousness: awake and alert Pain management: pain level controlled Vital Signs Assessment: post-procedure vital signs reviewed and stable Respiratory status: spontaneous breathing, nonlabored ventilation, respiratory function stable and patient connected to nasal cannula oxygen Cardiovascular status: blood pressure returned to baseline and stable Postop Assessment: no apparent nausea or vomiting Anesthetic complications: no   No complications documented.  Last Vitals:  Vitals:   01/03/21 2015 01/03/21 2030  BP: (!) 135/92 127/86  Pulse: (!) 59 60  Resp: 15 15  Temp:  36.7 C  SpO2: 99% 99%    Last Pain:  Vitals:   01/03/21 2015  TempSrc:   PainSc: 4                  Tiajuana Amass

## 2021-01-03 NOTE — Op Note (Signed)
PATIENT NAME: Chad Cole   MEDICAL RECORD NO.:   [id number]   DATE OF BIRTH: [birth year]   DATE OF PROCEDURE: 01/03/2021                               OPERATIVE REPORT     PREOPERATIVE DIAGNOSES: 1. Right-sided L1 radiculopathy. 2. Right-sided L1/2 foraminal disk herniation causing severe     compression of the right L1 nerve.   POSTOPERATIVE DIAGNOSES: 1. Right-sided L1 radiculopathy. 2. Large right-sided L1/2 foraminal disk herniation causing severe     compression of the right L1 nerve.   PROCEDURES:  Right-sided L1/2 partial facetectomy and removal of large herniated foraminal right-sided L1/2 disk fragment.   SURGEON:  Phylliss Bob, MD.   ASSISTANTPricilla Holm, PA-C.   ANESTHESIA:  General endotracheal anesthesia.   COMPLICATIONS:  None.   DISPOSITION:  Stable.   ESTIMATED BLOOD LOSS:  Minimal.   INDICATIONS FOR SURGERY:  Briefly, Chad Cole is a pleasant 50 year old male, who did present to me with severe pain in the right thigh.  The patient's MRI did reveal the findings outlined above, clearly notable for a large foraminal herniated disk fragment. The pain was rather severe and he did have weakness in his leg as well.  We did discuss treatment options and we did ultimately elect to proceed with the procedure reflected above.  The patient was fully made aware of the risks of surgery, including the risk of recurrent herniation and the need for subsequent surgery, including the possibility of a subsequent diskectomy and/or fusion.   OPERATIVE DETAILS:  On 01/03/2021, the patient was brought to surgery and general endotracheal anesthesia was administered.  The patient was placed prone on a well-padded flat Jackson bed with a spinal frame.  Antibiotics were given.  The back was prepped and draped and a time-out procedure was performed.  At this point, a right sided paramedian incision was made directly over the L1/2 intervertebral space.  An incision was made  into the fascia, and a Wiltsie plane was identified.  A self-retaining Thompson retractor was placed over the lateral aspect of the L1 lamina.  Using a high-speed bur and Kerrison punches, I did remove the lateral aspect of the L1/2 facet joint.    The ligamentum flavum was identified, and the lateral aspect of it was removed.    A prominent disc herniation was immediately encountered, in its expected location.  The exiting L1 nerve was readily noted.  Using a [facility], I did superiorly and laterally retract the L1 nerve, and readily noted was a large extruded disc fragment located at the level of the intervertebral disc space, and just behind the L1 vertebral body, just above the intervertebral space.  With gentle safe superior and lateral retraction of the nerve, I did remove the extruded fragments in multiple fragments.  Some of the fragments were readily removed, and other fragments were adherent to the annulus, and did require utilization of reverse angled Epstein curette to thoroughly remove them and thoroughly decompress the exiting right L1 nerve. Upon additional exploration of the foraminal extraforaminal region, I was able to confirm that there was no additional compression of the nerve. I was very pleased with the final decompression that I was able to accomplish.  At this point, the wound was copiously irrigated with normal saline.  All epidural bleeding was controlled using bipolar electrocautery in addition to Surgiflo. All bleeding was  controlled at the termination of the procedure.  At this point, 40 mg of Depo-Medrol was introduced about the epidural space in the region of the right L1 nerve.  The wound was then closed in layers using #1 Vicryl followed by 0 Vicryl, followed by 4-0 Monocryl. Benzoin and Steri-Strips were applied followed by a sterile dressing. All instrument counts were correct at the termination of the procedure.   Of note, Pricilla Holm was my assistant  throughout surgery, and did aid in retraction, suctioning, and closure from start to finish.       Phylliss Bob, MD

## 2021-01-03 NOTE — H&P (Signed)
PREOPERATIVE H&P  Chief Complaint: Right thigh pain  HPI: Chad Cole is a 50 y.o. male who presents with ongoing pain in the right thigh  MRI reveals a moderate right L1/2 HNP, compressing the exiting right L1 nerve  Patient has failed multiple forms of conservative care and continues to have pain (see office notes for additional details regarding the patient's full course of treatment)  Past Medical History:  Diagnosis Date  . Anxiety   . Cancer ([place]) 10/2012   malignant melanoma & basal cell  . History of kidney stones   . Hypertension   . Migraine   . PONV (postoperative nausea and vomiting)    Past Surgical History:  Procedure Laterality Date  . CERVICAL SPINE SURGERY    . GANGLION CYST EXCISION Left 2014   L foot w/ nerve entrapment and subsequent surgery  . KNEE SURGERY     bilateral.  . MELANOMA EXCISION     one inside right thigh, one on top of head and left cheek  . SHOULDER ARTHROSCOPY WITH BICEPS TENDON REPAIR Left    Social History   Socioeconomic History  . Marital status: Married    Spouse name: Not on file  . Number of children: 2  . Years of education: Associates  . Highest education level: Not on file  Occupational History  . Not on file  Tobacco Use  . Smoking status: Never Smoker  . Smokeless tobacco: Never Used  Vaping Use  . Vaping Use: Never used  Substance and Sexual Activity  . Alcohol use: No  . Drug use: No  . Sexual activity: Not on file  Other Topics Concern  . Not on file  Social History Narrative   Lives at home with wife and daughter (one daughter in college).   Right-handed.   2 cups caffeine per week.         Epworth Sleepiness Scale Score: 7   Social Determinants of Health   Financial Resource Strain: Not on file  Food Insecurity: Not on file  Transportation Needs: Not on file  Physical Activity: Not on file  Stress: Not on file  Social Connections: Not on file   Family History  Problem Relation Age of  Onset  . COPD Mother   . Migraines Mother   . Heart attack Father 67       MI at 55, 76, & 52  . Hypertension Father   . COPD Father   . Hyperlipidemia Father   . Cancer Father        cancer in his thumb really weird name pt says  . Bipolar disorder Brother   . Dementia Maternal Grandmother   . Diabetes Maternal Grandfather   . Heart disease Paternal Grandmother        bad heart valve  . Heart attack Paternal Grandfather   . Cancer - Lung Paternal Grandfather   . Cancer Paternal Grandfather        lung and leukemia  . Scoliosis Daughter    Allergies  Allergen Reactions  . Escitalopram Rash  . Lyrica [Pregabalin] Rash   Prior to Admission medications   Medication Sig Start Date End Date Taking? Authorizing Provider  ALPRAZolam Duanne Moron) 0.5 MG tablet Take 1 tablet (0.5 mg total) by mouth 3 (three) times daily as needed for anxiety. 10/08/13  Yes Pamella Pert, MD  cetirizine (ZYRTEC) 10 MG tablet Take 10 mg by mouth daily as needed for allergies.   Yes [provider]  hydrochlorothiazide (HYDRODIURIL) 25 MG tablet TAKE 1 TABLET BY MOUTH EVERY DAY Patient taking differently: Take 25 mg by mouth daily. 10/02/17  Yes Barrett, Evelene Croon, PA-C  Multiple Vitamin (MULTIVITAMIN WITH MINERALS) TABS tablet Take 1 tablet by mouth daily.   Yes [provider]  Multiple Vitamins-Minerals (MULTIVITAMIN WITH MINERALS) tablet Take 1 tablet by mouth daily.   Yes [provider]  nadolol (CORGARD) 40 MG tablet Take 40 mg by mouth daily.  05/24/16  Yes [provider]  RELPAX 40 MG tablet Take 40 mg by mouth every 2 (two) hours as needed for migraine.  07/14/13  Yes [provider]  sertraline (ZOLOFT) 25 MG tablet Take 25 mg by mouth daily. 01/15/18  Yes [provider]  topiramate (TOPAMAX) 25 MG tablet Take 1 tablet po at bedtime for 1 week then increase to 2 tablets at bedtime Patient taking differently: Take 25 mg by mouth once a week.  Wednesday 10/28/16  Yes Ward Givens, NP     All other systems have been reviewed and were otherwise negative with the exception of those mentioned in the HPI and as above.  Physical Exam: There were no vitals filed for this visit.  There is no height or weight on file to calculate BMI.  General: Alert, no acute distress Cardiovascular: No pedal edema Respiratory: No cyanosis, no use of accessory musculature Skin: No lesions in the area of chief complaint Neurologic: Sensation intact distally Psychiatric: Patient is competent for consent with normal mood and affect Lymphatic: No axillary or cervical lymphadenopathy   Assessment/Plan: RIGHT LUMBAR 1 RADICULOPATHY SECONDARY TO FORAMINAL RIGHT LUMBAR 1- LUMBAR 2 DISC HERNIATION Plan for Procedure(s): LUMBAR 1- LUMBAR 2 TRANSPEDICULAR DECOMPRESSION AND DISCECTOMY   Norva Karvonen, MD 01/03/2021 10:56 AM

## 2021-01-04 ENCOUNTER — Encounter (HOSPITAL_COMMUNITY): Payer: Self-pay | Admitting: Orthopedic Surgery

## 2021-01-04 MED FILL — Thrombin For Soln Kit 20000 Unit: CUTANEOUS | Qty: 1 | Status: AC

## 2021-01-23 DIAGNOSIS — M545 Low back pain, unspecified: Secondary | ICD-10-CM | POA: Diagnosis not present

## 2021-02-19 ENCOUNTER — Other Ambulatory Visit: Payer: Self-pay

## 2021-02-19 ENCOUNTER — Encounter (HOSPITAL_BASED_OUTPATIENT_CLINIC_OR_DEPARTMENT_OTHER): Payer: Self-pay | Admitting: Emergency Medicine

## 2021-02-19 ENCOUNTER — Emergency Department (HOSPITAL_BASED_OUTPATIENT_CLINIC_OR_DEPARTMENT_OTHER)
Admission: EM | Admit: 2021-02-19 | Discharge: 2021-02-19 | Disposition: A | Discharge status: Home / Self Care | Payer: Federal, State, Local not specified - PPO | Attending: Emergency Medicine | Admitting: Emergency Medicine

## 2021-02-19 DIAGNOSIS — Y9389 Activity, other specified: Secondary | ICD-10-CM | POA: Insufficient documentation

## 2021-02-19 DIAGNOSIS — Z85828 Personal history of other malignant neoplasm of skin: Secondary | ICD-10-CM | POA: Diagnosis not present

## 2021-02-19 DIAGNOSIS — I10 Essential (primary) hypertension: Secondary | ICD-10-CM | POA: Insufficient documentation

## 2021-02-19 DIAGNOSIS — S79922A Unspecified injury of left thigh, initial encounter: Secondary | ICD-10-CM | POA: Diagnosis not present

## 2021-02-19 DIAGNOSIS — S71112A Laceration without foreign body, left thigh, initial encounter: Secondary | ICD-10-CM

## 2021-02-19 DIAGNOSIS — W293XXA Contact with powered garden and outdoor hand tools and machinery, initial encounter: Secondary | ICD-10-CM | POA: Diagnosis not present

## 2021-02-19 MED ORDER — LIDOCAINE-EPINEPHRINE (PF) 2 %-1:200000 IJ SOLN
10.0000 mL | Freq: Once | INTRAMUSCULAR | Status: AC
  Administered 2021-02-19: 10 mL
  Filled 2021-02-19: qty 20

## 2021-02-19 NOTE — ED Provider Notes (Signed)
[place] EMERGENCY DEPARTMENT Provider Note   CSN: [id number] Arrival date & time: 02/19/21  1034     History Chief Complaint  Patient presents with  . Extremity Laceration    Chad Cole is a 50 y.o. male.  HPI Patient is a 50 year old male with a medical history as noted below.  Patient states he was using a chainsaw and when cutting the tree accidentally swung too far and caught his left thigh with a chain.  He reports a laceration to the left anterolateral thigh.  No active bleeding.  Reports throbbing pain at the site.  No numbness or weakness.  No other complaints.  His last Tdap was last year.    Past Medical History:  Diagnosis Date  . Anxiety   . Cancer ([place]) 10/2012   malignant melanoma & basal cell  . History of kidney stones   . Hypertension   . Migraine   . PONV (postoperative nausea and vomiting)     Patient Active Problem List   Diagnosis Date Noted  . 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 Hypertensive disorder 10/12/2018  . Cervical radiculopathy 08/27/2018  . Shoulder pain 07/02/2018  . Chronic migraine without aura, not intractable 05/29/2016  . Panic attack 10/08/2013  . Anxiety 10/08/2013  . Left foot pain 08/24/2013  . History of migraine headaches 08/09/2013  . FH: CAD (coronary artery disease) 08/09/2013  . Lower leg edema 08/09/2013  . Right groin pain 08/03/2013  . Umbilical hernia 24/40/1027    Past Surgical History:  Procedure Laterality Date  . BACK SURGERY    . CERVICAL SPINE SURGERY    . GANGLION CYST EXCISION Left 2014   L foot w/ nerve entrapment and subsequent surgery  . KNEE SURGERY     bilateral.  . LUMBAR LAMINECTOMY/DECOMPRESSION MICRODISCECTOMY N/A 01/03/2021   Procedure: LUMBAR 1- LUMBAR 2 DECOMPRESSION;  Surgeon: Phylliss Bob, MD;  Location: [facility];  Service: Orthopedics;  Laterality: N/A;  . MELANOMA EXCISION     one inside right thigh, one on top of head and left cheek  . SHOULDER  ARTHROSCOPY WITH BICEPS TENDON REPAIR Left        Family History  Problem Relation Age of Onset  . COPD Mother   . Migraines Mother   . Heart attack Father 17       MI at 46, 26, & 34  . Hypertension Father   . COPD Father   . Hyperlipidemia Father   . Cancer Father        cancer in his thumb really weird name pt says  . Bipolar disorder Brother   . Dementia Maternal Grandmother   . Diabetes Maternal Grandfather   . Heart disease Paternal Grandmother        bad heart valve  . Heart attack Paternal Grandfather   . Cancer - Lung Paternal Grandfather   . Cancer Paternal Grandfather        lung and leukemia  . Scoliosis Daughter     Social History   Tobacco Use  . Smoking status: Never Smoker  . Smokeless tobacco: Never Used  Vaping Use  . Vaping Use: Never used  Substance Use Topics  . Alcohol use: No  . Drug use: No    Home Medications Prior to Admission medications   Medication Sig Start Date End Date Taking? Authorizing Provider  ALPRAZolam Duanne Moron) 0.5 MG tablet Take 1 tablet (0.5 mg total) by mouth 3 (three) times daily as needed  for anxiety. 10/08/13   Pamella Pert, MD  cetirizine (ZYRTEC) 10 MG tablet Take 10 mg by mouth daily as needed for allergies.    [provider]  hydrochlorothiazide (HYDRODIURIL) 25 MG tablet TAKE 1 TABLET BY MOUTH EVERY DAY Patient taking differently: Take 25 mg by mouth daily. 10/02/17   Barrett, Evelene Croon, PA-C  HYDROcodone-acetaminophen (NORCO) 7.5-325 MG tablet Take 1 tablet by mouth every 6 (six) hours as needed for moderate pain. 01/03/21   McKenzie, Lennie Muckle, PA-C  methocarbamol (ROBAXIN) 500 MG tablet Take 1 tablet (500 mg total) by mouth every 6 (six) hours as needed for muscle spasms. 01/03/21   McKenzie, Lennie Muckle, PA-C  Multiple Vitamin (MULTIVITAMIN WITH MINERALS) TABS tablet Take 1 tablet by mouth daily.    [provider]  Multiple Vitamins-Minerals (MULTIVITAMIN WITH MINERALS) tablet Take 1 tablet by mouth  daily.    [provider]  nadolol (CORGARD) 40 MG tablet Take 40 mg by mouth daily.  05/24/16   [provider]  RELPAX 40 MG tablet Take 40 mg by mouth every 2 (two) hours as needed for migraine.  07/14/13   [provider]  sertraline (ZOLOFT) 25 MG tablet Take 25 mg by mouth daily. 01/15/18   [provider]  topiramate (TOPAMAX) 25 MG tablet Take 1 tablet po at bedtime for 1 week then increase to 2 tablets at bedtime Patient taking differently: Take 25 mg by mouth once a week. Wednesday 10/28/16   Ward Givens, NP    Allergies    Escitalopram and Lyrica [pregabalin]  Review of Systems   Review of Systems  All other systems reviewed and are negative. Ten systems reviewed and are negative for acute change, except as noted in the HPI.   Physical Exam Updated Vital Signs BP (!) 151/98 (BP Location: Right Arm)   Pulse 87   Temp 97.9 F (36.6 C) (Oral)   Resp 18   Ht 5\' 7"  (1.702 m)   Wt 86.2 kg   SpO2 96%   BMI 29.76 kg/m   Physical Exam Vitals and nursing note reviewed.  Constitutional:      General: He is not in acute distress.    Appearance: Normal appearance. He is not ill-appearing, toxic-appearing or diaphoretic.  HENT:     Head: Normocephalic and atraumatic.     Right Ear: External ear normal.     Left Ear: External ear normal.     Nose: Nose normal.     Mouth/Throat:     Mouth: Mucous membranes are moist.     Pharynx: Oropharynx is clear. No oropharyngeal exudate or posterior oropharyngeal erythema.  Eyes:     Extraocular Movements: Extraocular movements intact.  Cardiovascular:     Rate and Rhythm: Normal rate.     Pulses: Normal pulses.  Pulmonary:     Effort: Pulmonary effort is normal.  Abdominal:     General: Abdomen is flat. There is no distension.  Musculoskeletal:        General: Tenderness present. Normal range of motion.     Cervical back: Normal range of motion and neck supple. No tenderness.  Skin:    General:  Skin is warm and dry.     Findings: Laceration present.     Comments: 5 cm well approximated linear laceration to the left anterolateral thigh.  No active bleeding.  Mild tenderness overlying the site.  Neurological:     General: No focal deficit present.     Mental Status: He  is alert and oriented to person, place, and time.     Comments: Distal sensation intact in the bilateral lower extremities.  Patient is ambulatory.  2+ DP pulses.  No gross deficits.  Psychiatric:        Mood and Affect: Mood normal.        Behavior: Behavior normal.    ED Results / Procedures / Treatments   Labs (all labs ordered are listed, but only abnormal results are displayed) Labs Reviewed - No data to display  EKG None  Radiology No results found.  Procedures .Marland KitchenLaceration Repair  Date/Time: 02/19/2021 12:05 PM Performed by: Rayna Sexton, PA-C Authorized by: Rayna Sexton, PA-C   Consent:    Consent obtained:  Verbal   Consent given by:  Patient   Risks discussed:  Infection, need for additional repair, pain, poor cosmetic result and poor wound healing   Alternatives discussed:  No treatment and delayed treatment Universal protocol:    Procedure explained and questions answered to patient or proxy's satisfaction: yes     Relevant documents present and verified: yes     Test results available: yes     Imaging studies available: yes     Required blood products, implants, devices, and special equipment available: yes     Site/side marked: yes     Immediately prior to procedure, a time out was called: yes     Patient identity confirmed:  Verbally with patient Anesthesia:    Anesthesia method:  Local infiltration   Local anesthetic:  Lidocaine 2% WITH epi Laceration details:    Location:  Leg   Leg location:  L upper leg   Length (cm):  5 Pre-procedure details:    Preparation:  Patient was prepped and draped in usual sterile fashion Treatment:    Amount of cleaning:  Extensive    Irrigation solution:  Sterile saline   Irrigation method:  Pressure wash   Visualized foreign bodies/material removed: no   Skin repair:    Repair method:  Sutures   Suture size:  4-0   Suture material:  Prolene   Suture technique:  Simple interrupted   Number of sutures:  5 Approximation:    Approximation:  Close Repair type:    Repair type:  Simple Post-procedure details:    Dressing:  Antibiotic ointment   Procedure completion:  Tolerated well, no immediate complications     Medications Ordered in ED Medications  lidocaine-EPINEPHrine (XYLOCAINE W/EPI) 2 %-1:200000 (PF) injection 10 mL (has no administration in time range)    ED Course  I have reviewed the triage vital signs and the nursing notes.  Pertinent labs & imaging results that were available during my care of the patient were reviewed by me and considered in my medical decision making (see chart for details).    MDM Rules/Calculators/A&P                          Pt is a 50 y.o. male who presents to the emergency department due to a laceration to the left thigh that occurred just prior to arrival.  Patient was using a chainsaw which slipped and struck the thigh.  Patient had a 5 cm laceration to the left anterior lateral thigh.  Wound was cleaned extensively by myself as well as nursing staff.  Mild tenderness at the site but otherwise no radiating pain or weakness.  No numbness.  Full range of motion of the left hip, knee, and ankle.  Neurovascular intact in the left leg.  2+ DP pulses.  Wound was cleaned extensively and closed with Prolene sutures.  Please see procedure note above.  Tdap is up-to-date.  Recommended suture removal in 10 to 14 days.  Discussed return precautions in length.  Feel that he is stable for discharge at this time and he is agreeable.  His questions were answered and he was amicable at the time of discharge.  Note: Portions of this report may have been transcribed using voice recognition  software. Every effort was made to ensure accuracy; however, inadvertent computerized transcription errors may be present.   Final Clinical Impression(s) / ED Diagnoses Final diagnoses:  Laceration of left thigh, initial encounter    Rx / DC Orders ED Discharge Orders    None       Rayna Sexton, PA-C 02/19/21 1209    Blanchie Dessert, MD 02/21/21 1816

## 2021-02-19 NOTE — ED Notes (Signed)
Left thigh wound sutured per PA and dressed with dry sterile drsg after bacitracin applied , pt given extra bandages

## 2021-02-19 NOTE — Discharge Instructions (Signed)
Please have your stitches removed in 10 to 14 days.  I would recommend you continue to apply antibiotic ointment to the wound 1-2 times per day.  Please keep it clean and dry.  I have attached information on wound care that you can reference.  If you develop any worsening swelling, pain, redness, fevers, vomiting, please come back to the emergency department for reevaluation.  It was a pleasure to meet you.

## 2021-02-19 NOTE — ED Triage Notes (Signed)
Pt arrives pov with driver, reports laceration to L thigh from chainsaw. Approx 3cm laceration noted to lateral thigh. Bleeding controlled

## 2021-04-19 DIAGNOSIS — G43809 Other migraine, not intractable, without status migrainosus: Secondary | ICD-10-CM | POA: Diagnosis not present

## 2021-04-19 DIAGNOSIS — F419 Anxiety disorder, unspecified: Secondary | ICD-10-CM | POA: Diagnosis not present

## 2021-04-19 DIAGNOSIS — Z0000 Encounter for general adult medical examination without abnormal findings: Secondary | ICD-10-CM | POA: Diagnosis not present

## 2021-04-19 DIAGNOSIS — I10 Essential (primary) hypertension: Secondary | ICD-10-CM | POA: Diagnosis not present

## 2021-04-19 DIAGNOSIS — R7303 Prediabetes: Secondary | ICD-10-CM | POA: Diagnosis not present

## 2021-04-19 DIAGNOSIS — Z125 Encounter for screening for malignant neoplasm of prostate: Secondary | ICD-10-CM | POA: Diagnosis not present

## 2021-05-07 DIAGNOSIS — M5416 Radiculopathy, lumbar region: Secondary | ICD-10-CM | POA: Diagnosis not present

## 2021-07-04 DIAGNOSIS — H93293 Other abnormal auditory perceptions, bilateral: Secondary | ICD-10-CM | POA: Diagnosis not present

## 2022-01-22 DIAGNOSIS — J029 Acute pharyngitis, unspecified: Secondary | ICD-10-CM | POA: Diagnosis not present

## 2022-01-22 DIAGNOSIS — E010 Iodine-deficiency related diffuse (endemic) goiter: Secondary | ICD-10-CM | POA: Diagnosis not present

## 2022-01-25 ENCOUNTER — Other Ambulatory Visit: Payer: Self-pay | Admitting: Family Medicine

## 2022-01-25 DIAGNOSIS — E010 Iodine-deficiency related diffuse (endemic) goiter: Secondary | ICD-10-CM

## 2022-01-30 ENCOUNTER — Ambulatory Visit
Admission: RE | Admit: 2022-01-30 | Discharge: 2022-01-30 | Disposition: A | Discharge status: Home / Self Care | Payer: Federal, State, Local not specified - PPO | Source: Ambulatory Visit | Attending: Family Medicine | Admitting: Family Medicine

## 2022-01-30 DIAGNOSIS — E079 Disorder of thyroid, unspecified: Secondary | ICD-10-CM | POA: Diagnosis not present

## 2022-01-30 DIAGNOSIS — E010 Iodine-deficiency related diffuse (endemic) goiter: Secondary | ICD-10-CM

## 2022-03-27 DIAGNOSIS — L578 Other skin changes due to chronic exposure to nonionizing radiation: Secondary | ICD-10-CM | POA: Diagnosis not present

## 2022-03-27 DIAGNOSIS — D229 Melanocytic nevi, unspecified: Secondary | ICD-10-CM | POA: Diagnosis not present

## 2022-03-27 DIAGNOSIS — L821 Other seborrheic keratosis: Secondary | ICD-10-CM | POA: Diagnosis not present

## 2022-05-03 DIAGNOSIS — A084 Viral intestinal infection, unspecified: Secondary | ICD-10-CM | POA: Diagnosis not present

## 2022-05-03 DIAGNOSIS — R142 Eructation: Secondary | ICD-10-CM | POA: Diagnosis not present

## 2022-05-03 DIAGNOSIS — R197 Diarrhea, unspecified: Secondary | ICD-10-CM | POA: Diagnosis not present

## 2022-05-09 DIAGNOSIS — I10 Essential (primary) hypertension: Secondary | ICD-10-CM | POA: Diagnosis not present

## 2022-05-09 DIAGNOSIS — Z0000 Encounter for general adult medical examination without abnormal findings: Secondary | ICD-10-CM | POA: Diagnosis not present

## 2022-05-09 DIAGNOSIS — R7303 Prediabetes: Secondary | ICD-10-CM | POA: Diagnosis not present

## 2022-05-09 DIAGNOSIS — Z1211 Encounter for screening for malignant neoplasm of colon: Secondary | ICD-10-CM | POA: Diagnosis not present

## 2022-05-09 DIAGNOSIS — Z125 Encounter for screening for malignant neoplasm of prostate: Secondary | ICD-10-CM | POA: Diagnosis not present

## 2022-07-18 DIAGNOSIS — J018 Other acute sinusitis: Secondary | ICD-10-CM | POA: Diagnosis not present

## 2022-08-08 DIAGNOSIS — J029 Acute pharyngitis, unspecified: Secondary | ICD-10-CM | POA: Diagnosis not present

## 2022-08-08 DIAGNOSIS — R058 Other specified cough: Secondary | ICD-10-CM | POA: Diagnosis not present

## 2023-02-05 DIAGNOSIS — N529 Male erectile dysfunction, unspecified: Secondary | ICD-10-CM | POA: Diagnosis not present

## 2023-02-05 DIAGNOSIS — J40 Bronchitis, not specified as acute or chronic: Secondary | ICD-10-CM | POA: Diagnosis not present

## 2023-05-19 DIAGNOSIS — I10 Essential (primary) hypertension: Secondary | ICD-10-CM | POA: Diagnosis not present

## 2023-05-19 DIAGNOSIS — Z0000 Encounter for general adult medical examination without abnormal findings: Secondary | ICD-10-CM | POA: Diagnosis not present

## 2023-05-19 DIAGNOSIS — Z1211 Encounter for screening for malignant neoplasm of colon: Secondary | ICD-10-CM | POA: Diagnosis not present

## 2023-05-19 DIAGNOSIS — Z125 Encounter for screening for malignant neoplasm of prostate: Secondary | ICD-10-CM | POA: Diagnosis not present

## 2023-05-19 DIAGNOSIS — R7303 Prediabetes: Secondary | ICD-10-CM | POA: Diagnosis not present

## 2023-06-19 DIAGNOSIS — D225 Melanocytic nevi of trunk: Secondary | ICD-10-CM | POA: Diagnosis not present

## 2023-06-19 DIAGNOSIS — L821 Other seborrheic keratosis: Secondary | ICD-10-CM | POA: Diagnosis not present

## 2023-06-19 DIAGNOSIS — L578 Other skin changes due to chronic exposure to nonionizing radiation: Secondary | ICD-10-CM | POA: Diagnosis not present

## 2023-06-19 DIAGNOSIS — Z85820 Personal history of malignant melanoma of skin: Secondary | ICD-10-CM | POA: Diagnosis not present

## 2023-06-19 DIAGNOSIS — D485 Neoplasm of uncertain behavior of skin: Secondary | ICD-10-CM | POA: Diagnosis not present

## 2023-06-19 DIAGNOSIS — L570 Actinic keratosis: Secondary | ICD-10-CM | POA: Diagnosis not present

## 2023-07-14 ENCOUNTER — Inpatient Hospital Stay: Payer: Federal, State, Local not specified - PPO | Attending: Nurse Practitioner | Admitting: Genetic Counselor

## 2023-07-14 ENCOUNTER — Inpatient Hospital Stay: Payer: Federal, State, Local not specified - PPO

## 2023-07-14 ENCOUNTER — Encounter: Payer: Self-pay | Admitting: Genetic Counselor

## 2023-07-14 ENCOUNTER — Other Ambulatory Visit: Payer: Self-pay | Admitting: Genetic Counselor

## 2023-07-14 DIAGNOSIS — Z85820 Personal history of malignant melanoma of skin: Secondary | ICD-10-CM

## 2023-07-14 DIAGNOSIS — Z801 Family history of malignant neoplasm of trachea, bronchus and lung: Secondary | ICD-10-CM

## 2023-07-14 DIAGNOSIS — C4371 Malignant melanoma of right lower limb, including hip: Secondary | ICD-10-CM

## 2023-07-14 DIAGNOSIS — Z806 Family history of leukemia: Secondary | ICD-10-CM

## 2023-07-14 DIAGNOSIS — Z808 Family history of malignant neoplasm of other organs or systems: Secondary | ICD-10-CM

## 2023-07-14 LAB — GENETIC SCREENING ORDER

## 2023-07-14 NOTE — Progress Notes (Signed)
REFERRING PROVIDER: Elmon Else, MD [address], [address] [place],  [place]  PRIMARY PROVIDER:  Noberto Retort, MD  PRIMARY REASON FOR VISIT:  Encounter Diagnoses  Name Primary?   Personal history of malignant melanoma Yes   Family history of skin cancer    Family history of lung cancer    Family history of leukemia      HISTORY OF PRESENT ILLNESS:   Mr. Neyer, a 52 y.o. male, was seen for a [facility] cancer genetics consultation at the request of Dr. Emily Filbert due to a personal history of melanoma.  Mr. Wayner presents to clinic today to discuss the possibility of a hereditary predisposition to cancer, to discuss genetic testing, and to further clarify his future cancer risks, as well as potential cancer risks for family members.    CANCER HISTORY:  Mr. Tondre has a history of three malignant melanoma diagnoses (right inner thigh, low back, and left angle of jaw), first beginning at age 44.  He reports several skin biopsies showing atypia. He also has a history of basal cell carcinoma on his scalp.    SCREENING/RISK FACTORS:  Prostate cancer screening: annually.  Colonoscopy: no; normal Cologuard.  History of sunburns.    Past Medical History:  Diagnosis Date   Anxiety    Cancer (HCC) 10/2012   malignant melanoma & basal cell   History of kidney stones    Hypertension    Migraine    PONV (postoperative nausea and vomiting)     Past Surgical History:  Procedure Laterality Date   BACK SURGERY     CERVICAL SPINE SURGERY     GANGLION CYST EXCISION Left 2014   L foot w/ nerve entrapment and subsequent surgery   KNEE SURGERY     bilateral.   LUMBAR LAMINECTOMY/DECOMPRESSION MICRODISCECTOMY N/A 01/03/2021   Procedure: LUMBAR 1- LUMBAR 2 DECOMPRESSION;  Surgeon: Estill Bamberg, MD;  Location: [facility] OR;  Service: Orthopedics;  Laterality: N/A;   MELANOMA EXCISION     one inside right thigh, one on top of head and left cheek   SHOULDER ARTHROSCOPY WITH BICEPS TENDON  REPAIR Left     FAMILY HISTORY:  We obtained a detailed, 4-generation family history.  Significant diagnoses are listed below: Family History  Problem Relation Age of Onset   Squamous cell carcinoma Father        thumb; dx >50   Lung cancer Paternal Grandfather        dx >50   Leukemia Paternal Grandfather        dx >50     Mr. Cribari is unaware of previous family history of genetic testing for hereditary cancer risks. There is no reported Ashkenazi Jewish ancestry. There is no known consanguinity.  GENETIC COUNSELING ASSESSMENT: Mr. Olave is a 52 y.o. male with a personal history of three cases of malignant melanoma before the age of 67 which is somewhat suggestive of a hereditary cancer syndrome and predisposition to cancer. We, therefore, discussed and recommended the following at today's visit.   DISCUSSION: We discussed that 5 - 10% of cancer is hereditary.  Most cases of hereditary melanoma are associated with mutations in CDKN2A.  There are other genes that can be associated with hereditary melanoma syndromes, including but not limited to MITF, POT1, and BRCA2.  We discussed that testing is beneficial for several reasons including knowing how to follow individuals for their cancer risks and understanding if other family members could be at risk for cancer and  allowing them to undergo genetic testing.   We reviewed the characteristics, features and inheritance patterns of hereditary cancer syndromes. We also discussed genetic testing, including the appropriate family members to test, the process of testing, insurance coverage and turn-around-time for results. We discussed the implications of a negative, positive, carrier and/or variant of uncertain significant result. We recommended Mr. Sandberg pursue genetic testing for a panel that includes genes associated with melanoma and other cancers.   Mr. Neuburger  was offered a common hereditary cancer panel (~40 genes) and an expanded  pan-cancer panel (~70 genes). Mr. Karwowski was informed of the benefits and limitations of each panel, including that expanded pan-cancer panels contain genes that do not have clear management guidelines at this point in time.  We also discussed that as the number of genes included on a panel increases, the chances of variants of uncertain significance increases.  After considering the benefits and limitations of each gene panel, Mr. Simonson  elected to have an expanded pan-cancer panel through [employer].  The Multi-Cancer + RNA Panel offered by [facility] includes sequencing and/or deletion/duplication analysis of the following 70 genes:  AIP*, ALK, APC*, ATM*, AXIN2*, BAP1*, BARD1*, BLM*, BMPR1A*, BRCA1*, BRCA2*, BRIP1*, CDC73*, CDH1*, CDK4, CDKN1B*, CDKN2A, CHEK2*, CTNNA1*, DICER1*, EPCAM (del/dup only), EGFR, FH*, FLCN*, GREM1 (promoter dup only), HOXB13, KIT, LZTR1, MAX*, MBD4, MEN1*, MET, MITF, MLH1*, MSH2*, MSH3*, MSH6*, MUTYH*, NF1*, NF2*, NTHL1*, PALB2*, PDGFRA, PMS2*, POLD1*, POLE*, POT1*, PRKAR1A*, PTCH1*, PTEN*, RAD51C*, RAD51D*, RB1*, RET, SDHA* (sequencing only), SDHAF2*, SDHB*, SDHC*, SDHD*, SMAD4*, SMARCA4*, SMARCB1*, SMARCE1*, STK11*, SUFU*, TMEM127*, TP53*, TSC1*, TSC2*, VHL*. RNA analysis is performed for * genes.  Sequencing and deletion duplication of preliminary evidence melanoma gene MC1R was added.   Based on Mr. Towers personal history of three cases of malignant melanoma, he meets NCCN medical criteria for genetic testing. Despite that he meets criteria, he may still have an out of pocket cost. We discussed that if his out of pocket cost for testing is over $100, the laboratory should contact him and discuss the self-pay prices and/or patient pay assistance programs.    PLAN: After considering the risks, benefits, and limitations, Mr. Stile provided informed consent to pursue genetic testing and the blood sample was sent to [facility] for analysis of the Multi-Cancer  +RNA Panel w/ analysis of MC1R. Results should be available within approximately 2-3 weeks' time, at which point they will be disclosed by telephone to Mr. Harbold, as will any additional recommendations warranted by these results. Mr. Kleeman will receive a summary of his genetic counseling visit and a copy of his results once available. This information will also be available in Epic.   Mr. Osen questions were answered to his satisfaction today. Our contact information was provided should additional questions or concerns arise. Thank you for the referral and allowing [place] to share in the care of your patient.   Sayra Frisby M. Rennie Plowman, MS, [facility] Genetic Counselor [email or web address] (P) [phone]  The patient was seen for a total of 40 minutes in face-to-face genetic counseling.  The patient was seen alone.  Drs. Gunnar Bulla and/or Mosetta Putt were available to discuss this case as needed.    _______________________________________________________________________ For Office Staff:  Number of people involved in session: 1 Was an Intern/ student involved with case: yes; [facility] intern Marlane Hatcher observed session

## 2023-07-21 ENCOUNTER — Encounter: Payer: Self-pay | Admitting: Genetic Counselor

## 2023-07-21 ENCOUNTER — Telehealth: Payer: Self-pay | Admitting: Genetic Counselor

## 2023-07-21 ENCOUNTER — Ambulatory Visit: Payer: Self-pay | Admitting: Genetic Counselor

## 2023-07-21 DIAGNOSIS — Z85820 Personal history of malignant melanoma of skin: Secondary | ICD-10-CM

## 2023-07-21 DIAGNOSIS — Z1379 Encounter for other screening for genetic and chromosomal anomalies: Secondary | ICD-10-CM

## 2023-07-21 DIAGNOSIS — Z806 Family history of leukemia: Secondary | ICD-10-CM

## 2023-07-21 DIAGNOSIS — Z801 Family history of malignant neoplasm of trachea, bronchus and lung: Secondary | ICD-10-CM

## 2023-07-21 DIAGNOSIS — Z808 Family history of malignant neoplasm of other organs or systems: Secondary | ICD-10-CM

## 2023-07-21 NOTE — Telephone Encounter (Signed)
Disclosed negative genetics and VUS in KIT.

## 2023-07-22 DIAGNOSIS — Z23 Encounter for immunization: Secondary | ICD-10-CM | POA: Diagnosis not present

## 2023-07-22 NOTE — Progress Notes (Signed)
HPI:   Chad Cole was previously seen in the [facility] due to a personal history of melanoma and concerns regarding a hereditary predisposition to cancer.    Chad Cole recent genetic test results were disclosed to him by telephone. These results and recommendations are discussed in more detail below.  CANCER HISTORY:  Chad Cole has a history of three malignant melanoma diagnoses (right inner thigh, low back, and left angle of jaw), first beginning at age 52.  He reports several skin biopsies showing atypia. He also has a history of basal cell carcinoma on his scalp.    Oncology History  Malignant melanoma of right thigh (HCC)  05/12/2019 Initial Diagnosis   Malignant melanoma of right thigh (HCC)   07/21/2023 Genetic Testing   Negative [facility] Multi-Cancer +RNA Panel + MC1R analysis.  VUS in KIT at c.391G>A (p.Asp131Asn).  Report date is 07/21/2023.    The Multi-Cancer + RNA Panel offered by [facility] includes sequencing and/or deletion/duplication analysis of the following 70 genes:  AIP*, ALK, APC*, ATM*, AXIN2*, BAP1*, BARD1*, BLM*, BMPR1A*, BRCA1*, BRCA2*, BRIP1*, CDC73*, CDH1*, CDK4, CDKN1B*, CDKN2A, CHEK2*, CTNNA1*, DICER1*, EPCAM (del/dup only), EGFR, FH*, FLCN*, GREM1 (promoter dup only), HOXB13, KIT, LZTR1, MAX*, MBD4, MEN1*, MET, MITF, MLH1*, MSH2*, MSH3*, MSH6*, MUTYH*, NF1*, NF2*, NTHL1*, PALB2*, PDGFRA, PMS2*, POLD1*, POLE*, POT1*, PRKAR1A*, PTCH1*, PTEN*, RAD51C*, RAD51D*, RB1*, RET, SDHA* (sequencing only), SDHAF2*, SDHB*, SDHC*, SDHD*, SMAD4*, SMARCA4*, SMARCB1*, SMARCE1*, STK11*, SUFU*, TMEM127*, TP53*, TSC1*, TSC2*, VHL*. RNA analysis is performed for * genes.     FAMILY HISTORY:  We obtained a detailed, 4-generation family history.  Significant diagnoses are listed below:      Family History  Problem Relation Age of Onset   Squamous cell carcinoma Father          thumb; dx >50   Lung cancer Paternal Grandfather          dx >50   Leukemia  Paternal Grandfather          dx >50       Chad Cole is unaware of previous family history of genetic testing for hereditary cancer risks. There is no reported Ashkenazi Jewish ancestry. There is no known consanguinity.    GENETIC TEST RESULTS:  The [facility] Multi-Cancer +RNA Panel + MC1R analysis found no pathogenic mutations.   The Multi-Cancer + RNA Panel offered by [facility] includes sequencing and/or deletion/duplication analysis of the following 70 genes:  AIP*, ALK, APC*, ATM*, AXIN2*, BAP1*, BARD1*, BLM*, BMPR1A*, BRCA1*, BRCA2*, BRIP1*, CDC73*, CDH1*, CDK4, CDKN1B*, CDKN2A, CHEK2*, CTNNA1*, DICER1*, EPCAM (del/dup only), EGFR, FH*, FLCN*, GREM1 (promoter dup only), HOXB13, KIT, LZTR1, MAX*, MBD4, MEN1*, MET, MITF, MLH1*, MSH2*, MSH3*, MSH6*, MUTYH*, NF1*, NF2*, NTHL1*, PALB2*, PDGFRA, PMS2*, POLD1*, POLE*, POT1*, PRKAR1A*, PTCH1*, PTEN*, RAD51C*, RAD51D*, RB1*, RET, SDHA* (sequencing only), SDHAF2*, SDHB*, SDHC*, SDHD*, SMAD4*, SMARCA4*, SMARCB1*, SMARCE1*, STK11*, SUFU*, TMEM127*, TP53*, TSC1*, TSC2*, VHL*. RNA analysis is performed for * genes.  The test report has been scanned into EPIC and is located under the Molecular Pathology section of the Results Review tab.  A portion of the result report is included below for reference. Genetic testing reported out on July 21, 2023.     Genetic testing identified a variant of uncertain significance (VUS) in the KIT gene called c.391G>A (p.Asp131Asn).  At this time, it is unknown if this variant is associated with an increased risk for cancer or if it is benign, but most uncertain variants are reclassified to benign. It should not be  used to make medical management decisions. With time, we suspect the laboratory will determine the significance of this variant, if any. If the laboratory reclassifies this variant, we will attempt to contact Chad Cole to discuss it further.   Even though a pathogenic variant was not identified, possible  explanations for the cancer in the family may include: There may be no hereditary risk for cancer in the family. The cancers in Chad Cole and/or his family may be sporadic/familial or due to other genetic and environmental factors.  This is most likely, as most cancer is not hereditary.  There may be a gene mutation in one of these genes that current testing methods cannot detect but that chance is small. There could be another gene that has not yet been discovered, or that we have not yet tested, that is responsible for the cancer diagnoses in the family.   Therefore, it is important to remain in touch with cancer genetics in the future so that we can continue to offer Chad Cole the most up to date genetic testing.    ADDITIONAL GENETIC TESTING:   Chad Cole genetic testing was fairly extensive.  If there are additional relevant genes identified to increase cancer risk that can be analyzed in the future, we would be happy to discuss and coordinate this testing at that time.     CANCER SCREENING RECOMMENDATIONS:  Chad Cole test result is considered negative (normal).  This means that we have not identified a hereditary cause for his personal history of melanoma at this time.   An individual's cancer risk and medical management are not determined by genetic test results alone. Overall cancer risk assessment incorporates additional factors, including personal medical history, family history, and any available genetic information that may result in a personalized plan for cancer prevention and surveillance. Therefore, it is recommended he continue to follow the cancer management and screening guidelines provided by his dermatology and primary healthcare provider.   RECOMMENDATIONS FOR FAMILY MEMBERS:   Since he did not inherit a identifiable mutation in a cancer predisposition gene included on this panel, his children could not have inherited a known mutation from him in one of these  genes. Individuals in this family might be at some increased risk of developing cancer, over the general population risk, due to the family history of cancer.  Individuals in the family should notify their providers of the family history of cancer, consider regular skin checks, and practice sun safety.  We do not recommend familial testing for the KIT variant of uncertain significance (VUS).  FOLLOW-UP:  Cancer genetics is a rapidly advancing field and it is possible that new genetic tests will be appropriate for him and/or his family members in the future. We encourage Chad Cole to remain in contact with cancer genetics, so we can update his personal and family histories and let him know of advances in cancer genetics that may benefit this family.   Our contact number was provided.  They are welcome to call us at anytime with additional questions or concerns.   Jazzlin Clements M. Rennie Plowman, MS, [facility] Genetic Counselor [email or web address] (P) [phone]

## 2023-08-12 DIAGNOSIS — D485 Neoplasm of uncertain behavior of skin: Secondary | ICD-10-CM | POA: Diagnosis not present

## 2023-08-12 DIAGNOSIS — D2271 Melanocytic nevi of right lower limb, including hip: Secondary | ICD-10-CM | POA: Diagnosis not present

## 2023-08-20 IMAGING — US US THYROID
1 series · 14 of 25 positions shown · non-contrast
Comparison: None

CLINICAL DATA: 50-year-old male with a history of thyroid
enlargement

EXAM:
THYROID ULTRASOUND
TECHNIQUE: Ultrasound examination of the thyroid gland and adjacent soft
tissues was performed.

[Series 1: us thyroid · 0.06mm/px · 51 acquisitions, 14 frames shown]
[im 1/51]
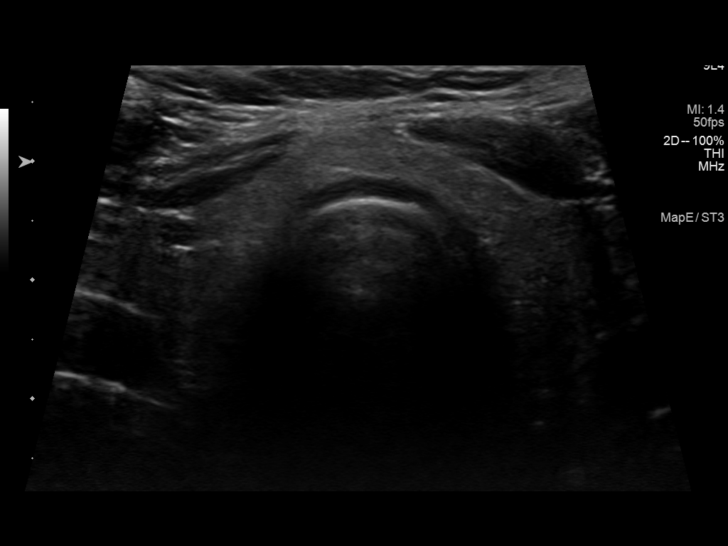
[im 5/51]
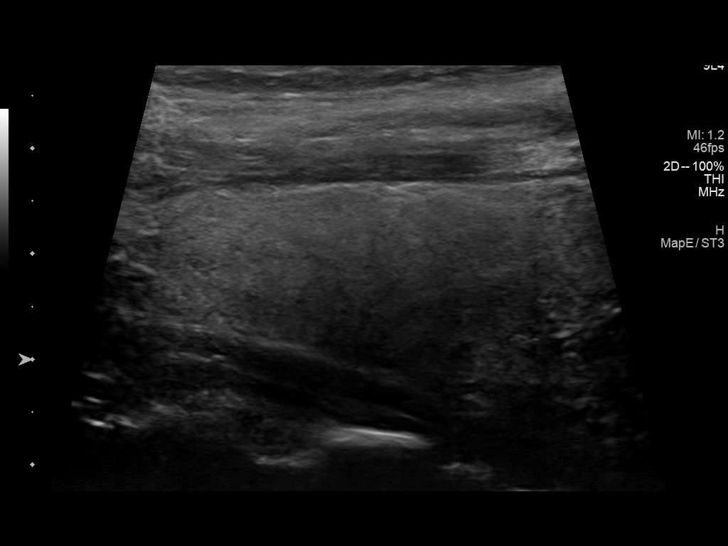
[im 9/51]
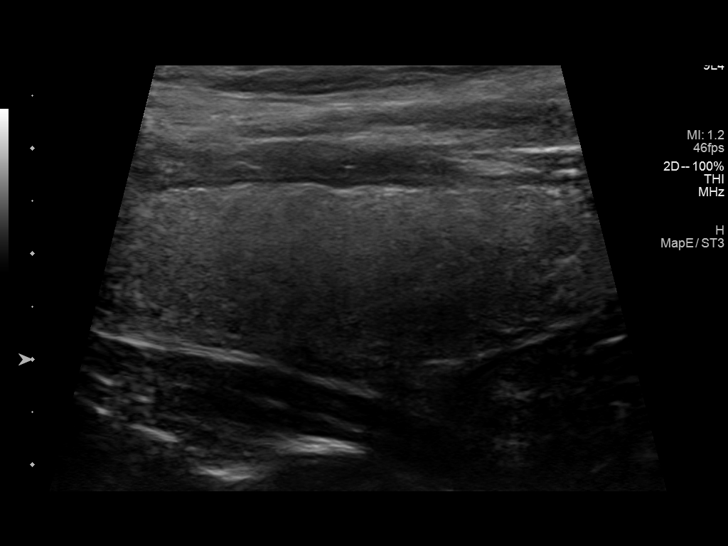
[im 13/51]
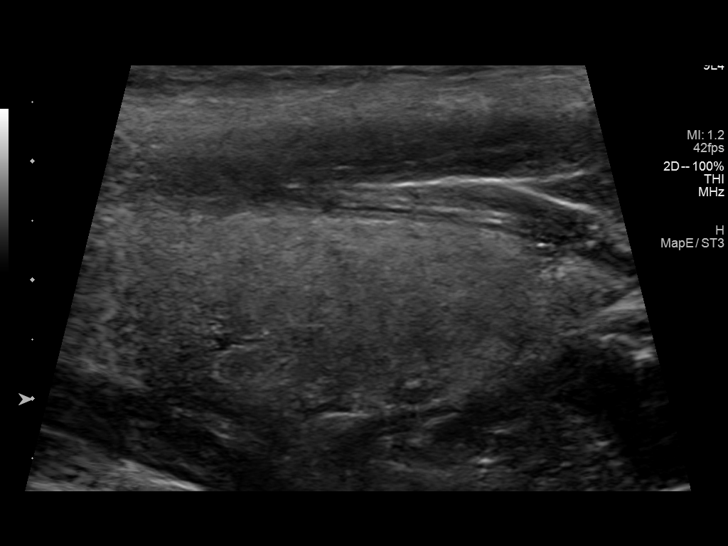
[im 17/51]
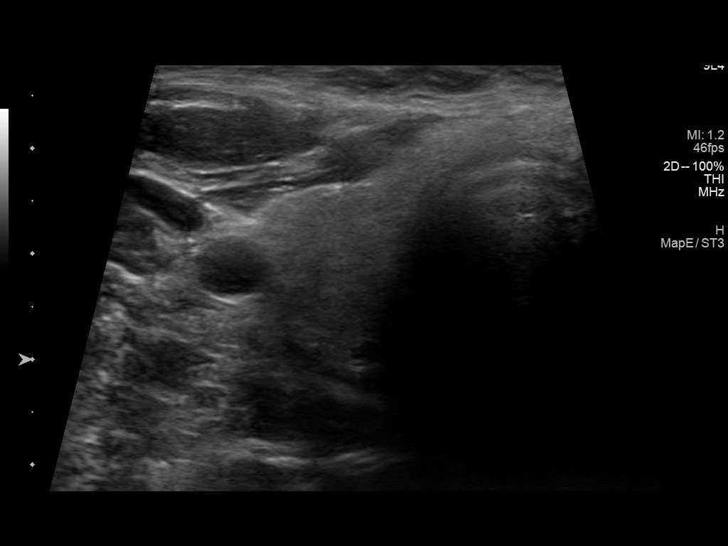
[im 19/51]
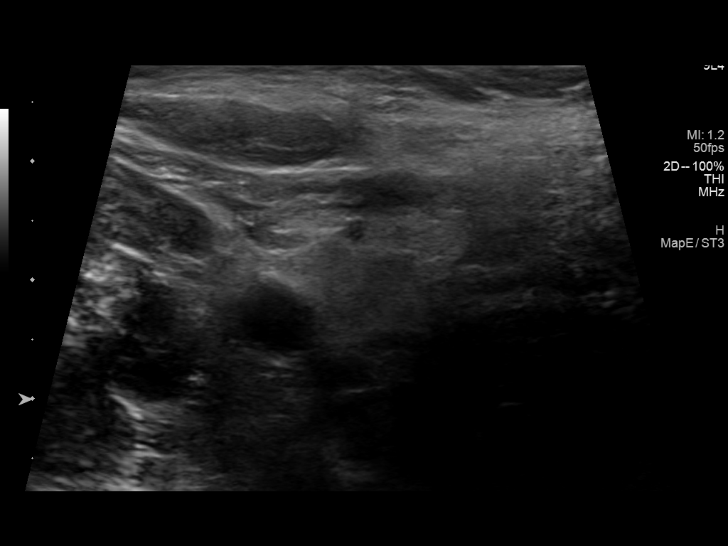
[im 23/51]
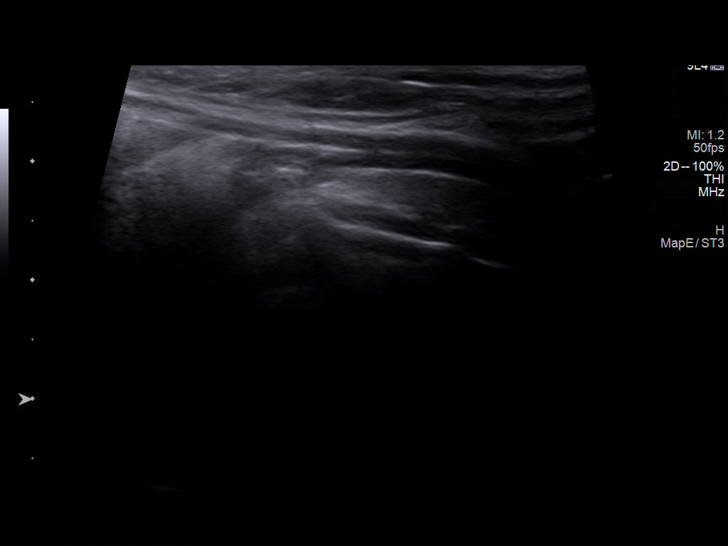
[im 28/51]
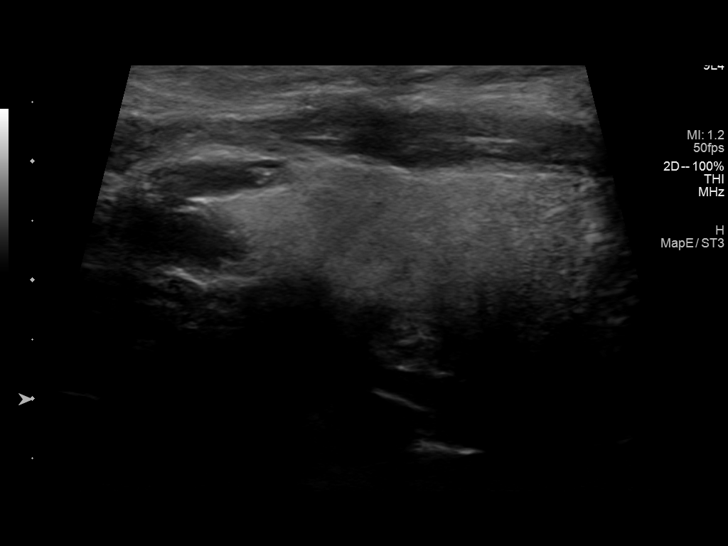
[im 32/51]
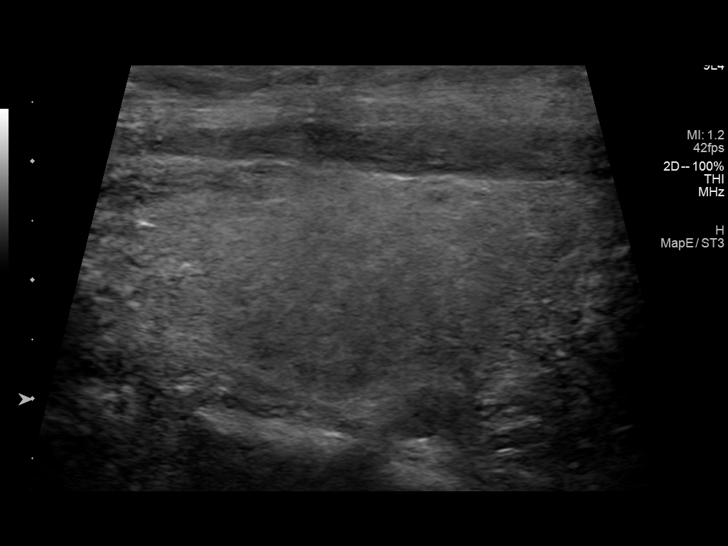
[im 34/51]
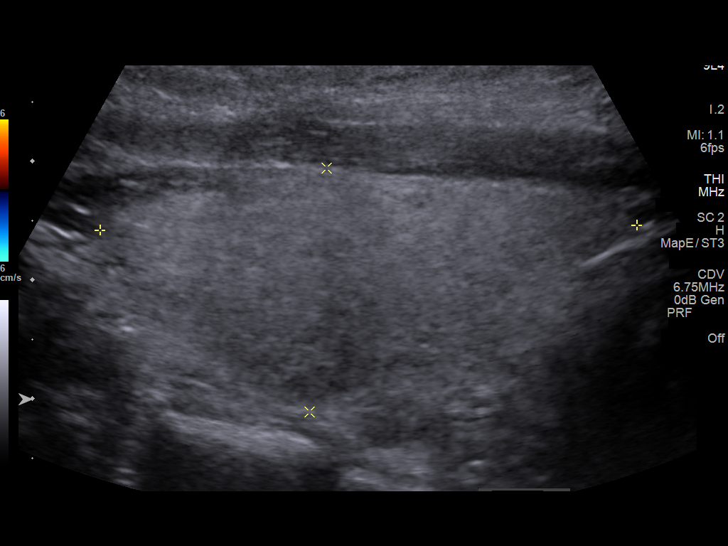
[im 38/51]
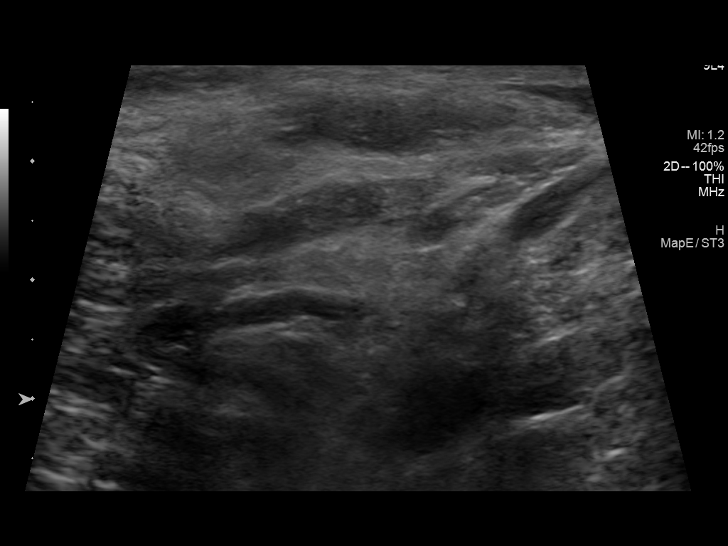
[im 42/51]
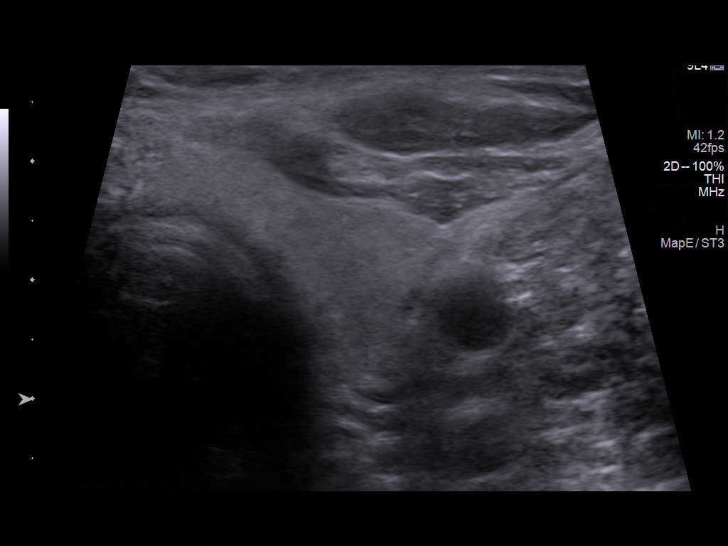
[im 46/51]
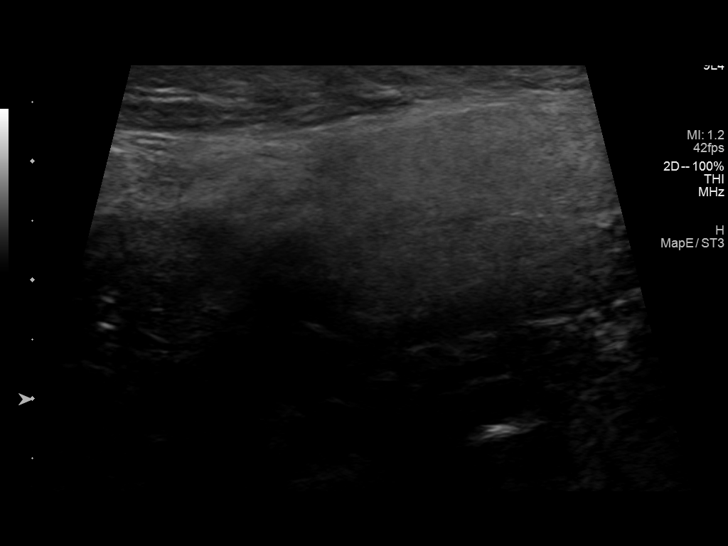
[im 51/51]
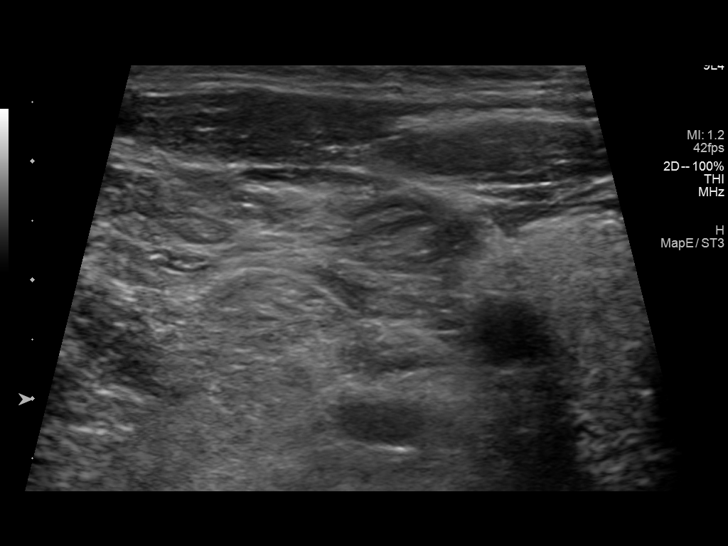

[14 of 25 positions shown; findings below may reference images not displayed]

FINDINGS: Parenchymal Echotexture: Mildly heterogenous

Isthmus: 0.4 cm

Right lobe: 0.5 cm x 2.0 cm x 1.4 cm

Left lobe: 4.5 cm x 2.1 cm x 1.4 cm

_________________________________________________________

Estimated total number of nodules >/= 1 cm: 0

Number of spongiform nodules >/=  2 cm not described below (TR1): 0

Number of mixed cystic and solid nodules >/= 1.5 cm not described
below (TR2): 0

_________________________________________________________

No discrete nodules are seen within the thyroid gland.

Typical appearing lymph node in the right cervical region.
IMPRESSION: Heterogeneous thyroid suggesting medical thyroid disease.

## 2024-06-11 ENCOUNTER — Other Ambulatory Visit (HOSPITAL_BASED_OUTPATIENT_CLINIC_OR_DEPARTMENT_OTHER): Payer: Self-pay | Admitting: Family Medicine

## 2024-06-11 DIAGNOSIS — Z136 Encounter for screening for cardiovascular disorders: Secondary | ICD-10-CM

## 2024-06-14 ENCOUNTER — Ambulatory Visit (HOSPITAL_BASED_OUTPATIENT_CLINIC_OR_DEPARTMENT_OTHER)
Admission: RE | Admit: 2024-06-14 | Discharge: 2024-06-14 | Disposition: A | Discharge status: Home / Self Care | Payer: Self-pay | Source: Ambulatory Visit | Attending: Family Medicine | Admitting: Family Medicine

## 2024-06-14 DIAGNOSIS — Z136 Encounter for screening for cardiovascular disorders: Secondary | ICD-10-CM | POA: Insufficient documentation

## 2024-06-23 ENCOUNTER — Other Ambulatory Visit: Payer: Self-pay | Admitting: Family Medicine

## 2024-06-23 DIAGNOSIS — R932 Abnormal findings on diagnostic imaging of liver and biliary tract: Secondary | ICD-10-CM

## 2024-06-24 ENCOUNTER — Other Ambulatory Visit: Payer: Self-pay | Admitting: Family Medicine

## 2024-06-24 DIAGNOSIS — R1319 Other dysphagia: Secondary | ICD-10-CM

## 2024-06-30 ENCOUNTER — Ambulatory Visit
Admission: RE | Admit: 2024-06-30 | Discharge: 2024-06-30 | Disposition: A | Discharge status: Home / Self Care | Payer: Self-pay | Source: Ambulatory Visit | Attending: Family Medicine | Admitting: Family Medicine

## 2024-06-30 DIAGNOSIS — R932 Abnormal findings on diagnostic imaging of liver and biliary tract: Secondary | ICD-10-CM

## 2024-07-19 NOTE — Progress Notes (Signed)
 Cardiology Office Note:  .   Date:  07/22/2024 ID:  Chad Cole, DOB July 06, 1971, MRN [id number] PCP: Chad Elsie SAUNDERS, MD [facility] Providers Cardiologist:  None   Patient Profile: .      PMH Coronary artery disease CT Calcium score 06/14/24 CAC score 76 (84th percentile) LM 0, LAD 65.6, LCx 10.4, RCA 0 Hypertension       History of Present Illness: .    Discussed the use of AI scribe software for clinical note transcription with the patient, who gave verbal consent to proceed.  History of Present Illness Chad Cole is a very pleasant 53 year old male who presents for evaluation of elevated coronary artery calcium score. CT calcium scan score of 76, placing him in the 84th percentile for age/sex matched controls. His family history is significant for heart disease, with his father experiencing a heart attack at 53 and other paternal relatives having multiple heart attacks. His grandmother had a heart valve issue. On his mother's side, there is a history of COPD, diabetes, liver issues, and asthma. His father and grandfather were heavy smokers. He is on hydrochlorothiazide  and nadolol for management of hypertension and reports BP is well controlled. He is not on cholesterol medication. Recent cholesterol panel results include an LDL of 122, total cholesterol of 199, and triglycerides of 112. He is prediabetic but has no history of diabetes. 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 He occasionally walks with his wife but does not engage in formal exercise. His diet consists mainly of one meal a day, usually supper, and he has tried intermittent fasting. He drinks water, zero-calorie Gatorade, and sugar-free tea. He has gained weight over the summer, contrary to his usual pattern of weight loss during this season. He experiences swelling in his left foot, especially during car rides, and had surgery on it over 15 years ago for a  cyst removal. He denies shortness of breath, chest pain, orthopnea, PND, palpitations, presyncope or syncope. Recently walked 3.1 miles in a fundraiser and completed the walk in < 48 minutes. Wanted to run at the end but was asked not to by coordinators.   Family history: His family history includes Bipolar disorder in his brother; COPD in his father and mother; Dementia in his maternal grandmother; Diabetes in his maternal grandfather; Heart attack in his paternal grandfather; Heart attack (age of onset: 6) in his father; Heart disease in his paternal grandmother; Hyperlipidemia in his father; Hypertension in his father; Leukemia in his paternal grandfather; Lung cancer in his paternal grandfather; Migraines in his mother; Scoliosis in his daughter; Squamous cell carcinoma in his father.  Father - 3 heart attacks, grandfather, paternal grandparents, PGM valve disease  Discussed the use of AI scribe software for clinical note transcription with the patient, who gave verbal consent to proceed.  ASCVD Risk Score: ASCVD (Atherosclerotic Cardiovascular Disease) Risk Algorithm including Known ASCVD from AHA/ACC from [email or web address] on 07/19/2024 ** All calculations should be rechecked by clinician prior to use **  RESULT SUMMARY: 3.7 % Risk of cardiovascular event (coronary or stroke death or non-fatal MI or stroke) in next 10 years.  No statin recommended because 10-year risk <5%; always encourage healthy cardiovascular lifestyle choices. Some patients with other high risk features may still be appropriate for treatment.   INPUTS: History of ASCVD --> 0 = No LDL Cholesterol >=190mg /dL (5.07 mmol/L) --> 0 = No Age --> 52 years Diabetes --> 0 =  No Sex --> 1 = Male Total Cholesterol --> 199 mg/dL HDL Cholesterol --> 57 mg/dL Systolic Blood Pressure --> 124 mm Hg Treatment for Hypertension --> 1 = Yes Smoker --> 0 = No Race --> 1 = White  The ASCVD Risk score (Arnett DK, et al., 2019) failed to  calculate for the following reasons:   Cannot find a previous HDL lab   Cannot find a previous total cholesterol lab    Diet: Stopped eating lunch because of work schedule Has been fasting at times, now often only eats supper   Activity: 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, 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 [employer] work at home, some push mowing No formal exercise  No results found for: LIPOA    ROS: See HPI       Studies Reviewed: SABRA   EKG Interpretation Date/Time:  Wednesday July 21 2024 14:53:05 EDT Ventricular Rate:  62 PR Interval:  160 QRS Duration:  88 QT Interval:  432 QTC Calculation: 438 R Axis:   13  Text Interpretation: Normal sinus rhythm Normal ECG When compared with ECG of 02-Jan-2021 11:01, Nonspecific T wave abnormality no longer evident in Anterolateral leads Confirmed by Percy Browning [phone]) on 07/21/2024 3:26:22 PM      Risk Assessment/Calculations:             Physical Exam:   VS: BP 128/86 (BP Location: Left Arm, Patient Position: Sitting, Cuff Size: Large)   Pulse 62   Ht 5' 7.5 (1.715 m)   Wt 202 lb (91.6 kg)   SpO2 98%   BMI 31.17 kg/m   Wt Readings from Last 3 Encounters:  07/21/24 202 lb (91.6 kg)  02/19/21 190 lb (86.2 kg)  01/03/21 192 lb (87.1 kg)     GEN: Well nourished, well developed in no acute distress NECK: No JVD; No carotid bruits CARDIAC: RRR, no murmurs, rubs, gallops RESPIRATORY:  Clear to auscultation without rales, wheezing or rhonchi  ABDOMEN: Soft, non-tender, non-distended EXTREMITIES:  No edema; No deformity     ASSESSMENT AND PLAN: .    Assessment & Plan Coronary artery calcification   Cardiac risk Prediabetes Coronary artery calcification with CAC of 76 (86th percentile) with bulk of calcification in LAD, and small amount in LCx.  EKG today with NSR at 62 bpm, nonspecific ST abnormality not concerning for ACS.  Family history of heart disease in multiple family members including his father and  paternal grandfather.  He reports they were also heavy smokers.  He has no personal history of smoking but does have history of hypertension and prediabetes.  Previous diagnosis of prediabetes with A1C > 5.7%. He reports recent weight loss secondary to eating less.  He often eats only 1-2 meals daily.  He does not exercise on a consistent basis but recently finished a 3.1 mile walk in 48 minutes and ran for a short time, but was asked not to run by coordinators. Says he felt great and had no chest pain, shortness of breath, palpitations, or other symptoms concerning for angina.  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  We discussed potential ischemia evaluation, however he is feeling well and would like to consider for a later time.  - Emphasized the importance of lifestyle management including healthy diet and regular exercise -Recommendation to gradually increase physical activity to aim for at least 150 minutes of moderate intensity exercise each week -Notify us  for symptoms  concerning for angina -Start rosuvastatin 5 mg daily  - Consider further cardiac testing if symptoms develop. - Recommend tracking dietary intake with an app  - Recommend weight lifting and resistance training for additional health benefits - Recommend heart healthy, whole food diet  on a healthy diet avoiding processed food, saturated fat, and other simple carbohydrates   Hyperlipidemia LDL goal < 70 Lipid panel completed 06/07/2024 with total cholesterol 199, HDL 57, LDL-C 91, and triglycerides 112. LDL cholesterol is at 122 mg/dL, with a target of 70 mg/dL or lower due to coronary calcification. Lifestyle changes are insufficient alone, so pharmacological intervention is needed. Start rosuvastatin 5 mg daily. Recheck cholesterol in 2-3 months. Encourage dietary modifications to reduce LDL.  Hypertension   BP is well controlled. He reports BP was not well controlled without the 2  anti-hypertensive agents. No change in antihypertensive therapy today.  Renal function stable on labs completed 06/07/2024. - Continue nadolol and hydrochlorothiazide   Left foot swelling Intermittent swelling of left foot with history of prior surgery to remove a growth from that foot. Advised that unilateral swelling less likely to be related to heart failure. No symptoms concerning for heart failure.  - Leg elevation encouraged   Plan/Goals: 1: Aim to reduce sugar intake and eat a more whole food diet avoiding processed foods and saturated fat 2: Aim to get at least 150 minutes of moderate intensity exercise each week along with remaining physcially active  everyday        Dispo: 3 months with me  Signed, Rosaline Bane, NP-C

## 2024-07-20 ENCOUNTER — Encounter: Payer: Self-pay | Admitting: Neurology

## 2024-07-21 ENCOUNTER — Ambulatory Visit (HOSPITAL_BASED_OUTPATIENT_CLINIC_OR_DEPARTMENT_OTHER): Payer: Self-pay | Admitting: Nurse Practitioner

## 2024-07-21 ENCOUNTER — Encounter (HOSPITAL_BASED_OUTPATIENT_CLINIC_OR_DEPARTMENT_OTHER): Payer: Self-pay | Admitting: Nurse Practitioner

## 2024-07-21 VITALS — BP 128/86 | HR 62 | Ht 67.5 in | Wt 202.0 lb

## 2024-07-21 DIAGNOSIS — R931 Abnormal findings on diagnostic imaging of heart and coronary circulation: Secondary | ICD-10-CM

## 2024-07-21 DIAGNOSIS — Z7189 Other specified counseling: Secondary | ICD-10-CM

## 2024-07-21 DIAGNOSIS — R7303 Prediabetes: Secondary | ICD-10-CM

## 2024-07-21 DIAGNOSIS — I10 Essential (primary) hypertension: Secondary | ICD-10-CM | POA: Diagnosis not present

## 2024-07-21 DIAGNOSIS — E785 Hyperlipidemia, unspecified: Secondary | ICD-10-CM | POA: Diagnosis not present

## 2024-07-21 DIAGNOSIS — Z5181 Encounter for therapeutic drug level monitoring: Secondary | ICD-10-CM

## 2024-07-21 DIAGNOSIS — M7989 Other specified soft tissue disorders: Secondary | ICD-10-CM

## 2024-07-21 MED ORDER — ROSUVASTATIN CALCIUM 5 MG PO TABS: 5.0000 mg | ORAL_TABLET | Freq: Every day | ORAL | 3 refills | Status: AC

## 2024-07-21 NOTE — Patient Instructions (Addendum)
 Medication Instructions:  START ROSUVASTATIN 5 MG DAILY   Labwork: FASTING LABS A COUPLE OF WEEKS PRIOR TO FOLLOW UP   Testing/Procedures: NONE  Follow-Up: 3 MONTHS WITH MICHELLE S NP   Any Other Special Instructions Will Be Listed Below (If Applicable).  Mediterranean Diet A Mediterranean diet is based on the traditions of countries on the [employer]. It focuses on eating more: Fruits and vegetables. Whole grains, beans, nuts, and seeds. Heart-healthy fats. These are fats that are good for your heart. It involves eating less: Dairy. Meat and eggs. Processed foods with added sugar, salt, and fat. This type of diet can help prevent certain conditions. It can also improve outcomes if you have a long-term (chronic) disease, such as kidney or heart disease. What are tips for following this plan? Reading food labels Check packaged foods for: The serving size. For foods such as rice and pasta, the serving size is the amount of cooked product, not dry. The total fat. Avoid foods with saturated fat or trans fat. Added sugars, such as corn syrup. Shopping  Try to have a balanced diet. Buy a variety of foods, such as: Fresh fruits and vegetables. You may be able to get these from local farmers markets. You can also buy them frozen. Grains, beans, nuts, and seeds. Some of these can be bought in bulk. Fresh seafood. Poultry and eggs. Low-fat dairy products. Buy whole ingredients instead of foods that have already been packaged. If you can't get fresh seafood, buy precooked frozen shrimp or canned fish, such as tuna, salmon, or sardines. Stock your pantry so you always have certain foods on hand, such as olive oil, canned tuna, canned tomatoes, rice, pasta, and beans. Cooking Cook foods with extra-virgin olive oil instead of using butter or other vegetable oils. Have meat as a side dish. Have vegetables or grains as your main dish. This means having meat in small portions or  adding small amounts of meat to foods like pasta or stew. Use beans or vegetables instead of meat in common dishes like chili or lasagna. Try out different cooking methods. Try roasting, broiling, steaming, and sauting vegetables. Add frozen vegetables to soups, stews, pasta, or rice. Add nuts or seeds for added healthy fats and plant protein at each meal. You can add these to yogurt, salads, or vegetable dishes. Marinate fish or vegetables using olive oil, lemon juice, garlic, and fresh herbs. Meal planning Plan to eat a vegetarian meal one day each week. Try to work up to two vegetarian meals, if possible. Eat seafood two or more times a week. Have healthy snacks on hand. These may include: Vegetable sticks with hummus. Greek yogurt. Fruit and nut trail mix. Eat balanced meals. These should include: Fruit: 2-3 servings a day. Vegetables: 4-5 servings a day. Low-fat dairy: 2 servings a day. Fish, poultry, or lean meat: 1 serving a day. Beans and legumes: 2 or more servings a week. Nuts and seeds: 1-2 servings a day. Whole grains: 6-8 servings a day. Extra-virgin olive oil: 3-4 servings a day. Limit red meat and sweets to just a few servings a month. Lifestyle  Try to cook and eat meals with your family. Drink enough fluid to keep your pee (urine) pale yellow. Be active every day. This includes: Aerobic exercise, which is exercise that causes your heart to beat faster. Examples include running and swimming. Leisure activities like gardening, walking, or housework. Get 7-8 hours of sleep each night. Drink red wine if your provider says you  can. A glass of wine is 5 oz (150 mL). You may be allowed to have: Up to 1 glass a day if you're male and not pregnant. Up to 2 glasses a day if you're male. What foods should I eat? Fruits Apples. Apricots. Avocado. Berries. Bananas. Cherries. Dates. Figs. Grapes. Lemons. Melon. Oranges. Peaches. Plums. Pomegranate. Vegetables Artichokes.  Beets. Broccoli. Cabbage. Carrots. Eggplant. Green beans. Chard. Kale. Spinach. Onions. Leeks. Peas. Squash. Tomatoes. Peppers. Radishes. Grains Whole-grain pasta. Brown rice. Bulgur wheat. Polenta. Couscous. Whole-wheat bread. [place] Madeira. Meats and other proteins Beans. Almonds. Sunflower seeds. Pine nuts. Peanuts. Cod. Salmon. Scallops. Shrimp. Tuna. Tilapia. Clams. Oysters. Eggs. Chicken or turkey without skin. Dairy Low-fat milk. Cheese. Greek yogurt. Fats and oils Extra-virgin olive oil. Avocado oil. Grapeseed oil. Beverages Water. Red wine. Herbal tea. Sweets and desserts Greek yogurt with honey. Baked apples. Poached pears. Trail mix. Seasonings and condiments Basil. Cilantro. Coriander. Cumin. Mint. Parsley. Sage. Rosemary. Tarragon. Garlic. Oregano. Thyme. Pepper. Balsamic vinegar. Tahini. Hummus. Tomato sauce. Olives. Mushrooms. The items listed above may not be all the foods and drinks you can have. Talk to a dietitian to learn more. What foods should I limit? This is a list of foods that should be eaten rarely. Fruits Fruit canned in syrup. Vegetables Deep-fried potatoes, like French fries. Grains Packaged pasta or rice dishes. Cereal with added sugar. Snacks with added sugar. Meats and other proteins Beef. Pork. Lamb. Chicken or turkey with skin. Hot dogs. [place]. Dairy Ice cream. Sour cream. Whole milk. Fats and oils Butter. Canola oil. Vegetable oil. Beef fat (tallow). Lard. Beverages Juice. Sugar-sweetened soft drinks. Beer. Liquor and spirits. Sweets and desserts Cookies. Cakes. Pies. Candy. Seasonings and condiments Mayonnaise. Pre-made sauces and marinades. The items listed above may not be all the foods and drinks you should limit. Talk to a dietitian to learn more. Where to find more information American Heart Association (AHA): heart.org This information is not intended to replace advice given to you by your health care provider. Make sure you discuss  any questions you have with your health care provider. Document Revised: 12/22/2022 Document Reviewed: 12/22/2022 Elsevier Patient Education  2024 [employer]  Adopting a Healthy Lifestyle.   Weight: Know what a healthy weight is for you (roughly BMI <25) and aim to maintain this. You can calculate your body mass index on your smart phone. Unfortunately, this is not the most accurate measure of healthy weight, but it is the simplest measurement to use. A more accurate measurement involves body scanning which measures lean muscle, fat tissue and bony density. We do not have this equipment at [facility].    Diet: Aim for 7+ servings of fruits and vegetables daily Limit animal fats in diet for cholesterol and heart health - choose grass fed whenever available Avoid highly processed foods (fast food burgers, tacos, fried chicken, pizza, hot dogs, french fries)  Saturated fat comes in the form of butter, lard, coconut oil, margarine, partially hydrogenated oils, and fat in meat. These increase your risk of cardiovascular disease.  Use healthy plant oils, such as olive, canola, soy, corn, sunflower and peanut.  Whole foods such as fruits, vegetables and whole grains have fiber  Men need > 38 grams of fiber per day Women need > 25 grams of fiber per day  Load up on vegetables and fruits - one-half of your plate: Aim for color and variety, and remember that potatoes dont count. Go for whole grains - one-quarter of your plate: Whole wheat, barley,  wheat berries, quinoa, oats, brown rice, and foods made with them. If you want pasta, go with whole wheat pasta. Protein power - one-quarter of your plate: Fish, chicken, beans, and nuts are all healthy, versatile protein sources. Limit red meat. You need carbohydrates for energy! The type of carbohydrate is more important than the amount. Choose carbohydrates such as vegetables, fruits, whole grains, beans, and nuts in the place of white rice, white pasta,  potatoes (baked or fried), macaroni and cheese, cakes, cookies, and donuts.  If youre thirsty, drink water. Coffee and tea are good in moderation, but skip sugary drinks and limit milk and dairy products to one or two daily servings. Keep sugar intake at 6 teaspoons or 24 grams or LESS       Exercise: Aim for 150 min of moderate intensity exercise weekly for heart health, and weights twice weekly for bone health Stay active - any steps are better than no steps! Aim for 7-9 hours of sleep daily

## 2024-07-22 ENCOUNTER — Encounter (HOSPITAL_BASED_OUTPATIENT_CLINIC_OR_DEPARTMENT_OTHER): Payer: Self-pay | Admitting: Nurse Practitioner

## 2024-07-29 ENCOUNTER — Inpatient Hospital Stay
Admission: RE | Admit: 2024-07-29 | Discharge: 2024-07-29 | Disposition: A | Payer: Self-pay | Source: Ambulatory Visit | Attending: Family Medicine | Admitting: Family Medicine

## 2024-07-29 DIAGNOSIS — R1319 Other dysphagia: Secondary | ICD-10-CM

## 2024-08-12 ENCOUNTER — Other Ambulatory Visit: Payer: Self-pay | Admitting: Physician Assistant

## 2024-08-12 DIAGNOSIS — M542 Cervicalgia: Secondary | ICD-10-CM

## 2024-08-26 ENCOUNTER — Telehealth: Payer: Self-pay

## 2024-08-26 NOTE — Discharge Instructions (Signed)

## 2024-08-26 NOTE — Progress Notes (Signed)
 Left message to return call today for myelogram prep

## 2024-08-27 ENCOUNTER — Inpatient Hospital Stay
Admission: RE | Admit: 2024-08-27 | Discharge: 2024-08-27 | Disposition: A | Source: Ambulatory Visit | Attending: Physician Assistant | Admitting: Physician Assistant

## 2024-08-27 ENCOUNTER — Ambulatory Visit
Admission: RE | Admit: 2024-08-27 | Discharge: 2024-08-27 | Disposition: A | Discharge status: Home / Self Care | Source: Ambulatory Visit | Attending: Physician Assistant | Admitting: Physician Assistant

## 2024-08-27 ENCOUNTER — Inpatient Hospital Stay
Admission: RE | Admit: 2024-08-27 | Discharge: 2024-08-27 | Disposition: A | Payer: Self-pay | Source: Ambulatory Visit | Attending: Physician Assistant | Admitting: Physician Assistant

## 2024-08-27 ENCOUNTER — Other Ambulatory Visit: Payer: Self-pay | Admitting: Physician Assistant

## 2024-08-27 DIAGNOSIS — M542 Cervicalgia: Secondary | ICD-10-CM

## 2024-08-27 MED ORDER — ONDANSETRON HCL 4 MG/2ML IJ SOLN: 4.0000 mg | Freq: Once | INTRAMUSCULAR | Status: DC | PRN

## 2024-08-27 MED ORDER — DIAZEPAM 5 MG PO TABS
10.0000 mg | ORAL_TABLET | Freq: Once | ORAL | Status: AC
  Administered 2024-08-27: 10 mg via ORAL

## 2024-08-27 MED ORDER — MEPERIDINE HCL 50 MG/ML IJ SOLN
50.0000 mg | Freq: Once | INTRAMUSCULAR | Status: AC | PRN
  Administered 2024-08-27: 50 mg via INTRAMUSCULAR

## 2024-08-27 MED ORDER — IOPAMIDOL (ISOVUE-M 300) INJECTION 61%
10.0000 mL | Freq: Once | INTRAMUSCULAR | Status: AC
  Administered 2024-08-27: 10 mL via INTRATHECAL

## 2024-10-23 LAB — NMR, LIPOPROFILE
Cholesterol, Total: 130 mg/dL (ref 100–199)
HDL Particle Number: 34.7 umol/L
HDL-C: 45 mg/dL
LDL Particle Number: 845 nmol/L
LDL Size: 20.5 nm — ABNORMAL LOW
LDL-C (NIH Calc): 65 mg/dL (ref 0–99)
LP-IR Score: 71 — ABNORMAL HIGH
Small LDL Particle Number: 462 nmol/L
Triglycerides: 108 mg/dL (ref 0–149)

## 2024-10-23 LAB — LIPOPROTEIN A (LPA): Lipoprotein (a): 32.2 nmol/L

## 2024-10-23 LAB — ALT: ALT: 23 [IU]/L (ref 0–44)

## 2024-10-24 ENCOUNTER — Ambulatory Visit (HOSPITAL_BASED_OUTPATIENT_CLINIC_OR_DEPARTMENT_OTHER): Payer: Self-pay | Admitting: Nurse Practitioner

## 2024-10-25 ENCOUNTER — Other Ambulatory Visit (HOSPITAL_COMMUNITY): Payer: Self-pay | Admitting: *Deleted

## 2024-10-25 DIAGNOSIS — R1310 Dysphagia, unspecified: Secondary | ICD-10-CM

## 2024-11-03 ENCOUNTER — Encounter (HOSPITAL_BASED_OUTPATIENT_CLINIC_OR_DEPARTMENT_OTHER): Admitting: Nurse Practitioner

## 2024-11-03 ENCOUNTER — Ambulatory Visit: Admitting: Neurology

## 2024-11-19 ENCOUNTER — Encounter (HOSPITAL_COMMUNITY)
